# Patient Record
Sex: Female | Born: 1935 | Race: White | Marital: Single | Smoking: Never smoker
Health system: Southern US, Community
[De-identification: ages and names within clinical notes are randomized; demographics above are authoritative.]

## PROBLEM LIST (undated history)

## (undated) DIAGNOSIS — J302 Other seasonal allergic rhinitis: Secondary | ICD-10-CM

## (undated) HISTORY — PX: OTHER SURGICAL HISTORY: SHX169

## (undated) HISTORY — PX: EYE SURGERY: SHX253

## (undated) HISTORY — PX: CHOLECYSTECTOMY: SHX55

---

## 2011-06-28 ENCOUNTER — Inpatient Hospital Stay: Payer: Self-pay | Admitting: Surgery

## 2011-06-28 LAB — PROTIME-INR: Prothrombin Time: 13.4 secs (ref 11.5–14.7)

## 2011-06-28 LAB — CK TOTAL AND CKMB (NOT AT ARMC)
CK, Total: 73 U/L (ref 21–215)
CK-MB: 1.1 ng/mL (ref 0.5–3.6)

## 2011-06-28 LAB — URINALYSIS, COMPLETE
Bilirubin,UR: NEGATIVE
Glucose,UR: NEGATIVE mg/dL (ref 0–75)
Ketone: NEGATIVE
Nitrite: NEGATIVE
Ph: 6 (ref 4.5–8.0)
Protein: 30
Transitional Epi: <1

## 2011-06-28 LAB — COMPREHENSIVE METABOLIC PANEL
Albumin: 3.5 g/dL (ref 3.4–5.0)
Alkaline Phosphatase: 67 U/L (ref 50–136)
Anion Gap: 9 (ref 7–16)
Bilirubin,Total: 0.3 mg/dL (ref 0.2–1.0)
Co2: 26 mmol/L (ref 21–32)
Creatinine: 0.72 mg/dL (ref 0.60–1.30)
EGFR (African American): >60
EGFR (Non-African Amer.): >60
Potassium: 3.9 mmol/L (ref 3.5–5.1)
SGPT (ALT): 12 U/L
Sodium: 129 mmol/L — ABNORMAL LOW (ref 136–145)
Total Protein: 8.4 g/dL — ABNORMAL HIGH (ref 6.4–8.2)

## 2011-06-28 LAB — TSH: Thyroid Stimulating Horm: 1.9 u[IU]/mL

## 2011-06-28 LAB — CBC
HCT: 37.6 % (ref 35.0–47.0)
HGB: 12.2 g/dL (ref 12.0–16.0)
MCH: 28.5 pg (ref 26.0–34.0)
MCV: 88 fL (ref 80–100)
Platelet: 339 10*3/uL (ref 150–440)
RDW: 12.7 % (ref 11.5–14.5)

## 2011-06-29 LAB — CBC WITH DIFFERENTIAL/PLATELET
Basophil #: 0 10*3/uL (ref 0.0–0.1)
Basophil %: 0.4 %
Eosinophil #: 0.3 10*3/uL (ref 0.0–0.7)
Eosinophil %: 2.3 %
HCT: 33.9 % — ABNORMAL LOW (ref 35.0–47.0)
Lymphocyte %: 21.3 %
MCH: 28.5 pg (ref 26.0–34.0)
MCHC: 32.5 g/dL (ref 32.0–36.0)
MCV: 88 fL (ref 80–100)
Monocyte #: 1.2 x10 3/mm — ABNORMAL HIGH (ref 0.2–0.9)
Neutrophil #: 7.5 10*3/uL — ABNORMAL HIGH (ref 1.4–6.5)
Neutrophil %: 65.8 %
RBC: 3.87 10*6/uL (ref 3.80–5.20)
RDW: 12.9 % (ref 11.5–14.5)
WBC: 11.4 10*3/uL — ABNORMAL HIGH (ref 3.6–11.0)

## 2011-06-29 LAB — HEPATIC FUNCTION PANEL A (ARMC)
Albumin: 2.7 g/dL — ABNORMAL LOW (ref 3.4–5.0)
Bilirubin,Total: 0.3 mg/dL (ref 0.2–1.0)
SGPT (ALT): 12 U/L
Total Protein: 6.7 g/dL (ref 6.4–8.2)

## 2011-06-29 LAB — BASIC METABOLIC PANEL
Anion Gap: 10 (ref 7–16)
Creatinine: 0.76 mg/dL (ref 0.60–1.30)
Potassium: 3.5 mmol/L (ref 3.5–5.1)
Sodium: 130 mmol/L — ABNORMAL LOW (ref 136–145)

## 2011-06-30 LAB — CBC WITH DIFFERENTIAL/PLATELET
Basophil #: 0.1 10*3/uL (ref 0.0–0.1)
Lymphocyte #: 2.9 10*3/uL (ref 1.0–3.6)
MCH: 28.1 pg (ref 26.0–34.0)
Monocyte %: 13.9 %
Neutrophil #: 5.9 10*3/uL (ref 1.4–6.5)
Platelet: 306 10*3/uL (ref 150–440)
RBC: 3.74 10*6/uL — ABNORMAL LOW (ref 3.80–5.20)

## 2011-06-30 LAB — BASIC METABOLIC PANEL
Calcium, Total: 8.1 mg/dL — ABNORMAL LOW (ref 8.5–10.1)
Chloride: 98 mmol/L (ref 98–107)
Co2: 27 mmol/L (ref 21–32)
Creatinine: 0.82 mg/dL (ref 0.60–1.30)
Glucose: 107 mg/dL — ABNORMAL HIGH (ref 65–99)
Osmolality: 266 (ref 275–301)
Potassium: 4.1 mmol/L (ref 3.5–5.1)

## 2011-07-03 ENCOUNTER — Encounter (HOSPITAL_COMMUNITY): Payer: Self-pay | Admitting: Emergency Medicine

## 2011-07-03 ENCOUNTER — Emergency Department (HOSPITAL_COMMUNITY)
Admission: EM | Admit: 2011-07-03 | Discharge: 2011-07-03 | Disposition: A | Discharge status: Home / Self Care | Payer: Medicare PPO | Attending: Emergency Medicine | Admitting: Emergency Medicine

## 2011-07-03 DIAGNOSIS — Z9889 Other specified postprocedural states: Secondary | ICD-10-CM | POA: Insufficient documentation

## 2011-07-03 DIAGNOSIS — R21 Rash and other nonspecific skin eruption: Secondary | ICD-10-CM | POA: Insufficient documentation

## 2011-07-03 DIAGNOSIS — L0291 Cutaneous abscess, unspecified: Secondary | ICD-10-CM | POA: Insufficient documentation

## 2011-07-03 DIAGNOSIS — L0390 Cellulitis, unspecified: Secondary | ICD-10-CM | POA: Insufficient documentation

## 2011-07-03 HISTORY — DX: Other seasonal allergic rhinitis: J30.2

## 2011-07-03 LAB — DIFFERENTIAL
Eosinophils Absolute: 0.4 10*3/uL (ref 0.0–0.7)
Eosinophils Relative: 3 % (ref 0–5)
Lymphocytes Relative: 18 % (ref 12–46)
Lymphs Abs: 2.7 10*3/uL (ref 0.7–4.0)
Monocytes Relative: 13 % — ABNORMAL HIGH (ref 3–12)

## 2011-07-03 LAB — CBC
Hemoglobin: 11.6 g/dL — ABNORMAL LOW (ref 12.0–15.0)
MCH: 28.9 pg (ref 26.0–34.0)
MCV: 86.6 fL (ref 78.0–100.0)
Platelets: 410 10*3/uL — ABNORMAL HIGH (ref 150–400)
RBC: 4.02 MIL/uL (ref 3.87–5.11)
WBC: 14.6 10*3/uL — ABNORMAL HIGH (ref 4.0–10.5)

## 2011-07-03 LAB — POCT I-STAT, CHEM 8
BUN: 12 mg/dL (ref 6–23)
Creatinine, Ser: 0.8 mg/dL (ref 0.50–1.10)
Glucose, Bld: 112 mg/dL — ABNORMAL HIGH (ref 70–99)
Potassium: 4.3 mEq/L (ref 3.5–5.1)
Sodium: 132 mEq/L — ABNORMAL LOW (ref 135–145)

## 2011-07-03 MED ORDER — CLINDAMYCIN PHOSPHATE 600 MG/50ML IV SOLN: 600.0000 mg | Freq: Once | INTRAVENOUS | Status: DC

## 2011-07-03 MED ORDER — CLINDAMYCIN PHOSPHATE 600 MG/50ML IV SOLN
600.0000 mg | Freq: Once | INTRAVENOUS | Status: AC
  Administered 2011-07-03: 600 mg via INTRAVENOUS
  Filled 2011-07-03: qty 50

## 2011-07-03 MED ORDER — CLINDAMYCIN HCL 150 MG PO CAPS: 300.0000 mg | ORAL_CAPSULE | Freq: Three times a day (TID) | ORAL | Status: AC

## 2011-07-03 NOTE — Discharge Instructions (Signed)

## 2011-07-03 NOTE — ED Notes (Signed)
PT. REPORTS RIGHT ANTERIOR ARM REDDNESS AT PREVIOUS IV SITE  , STATES DISCHARGED FROM HOSPITAL YESTERDAY . NO FEVER OR CHILLS.

## 2011-07-03 NOTE — ED Provider Notes (Signed)
Medical screening examination/treatment/procedure(s) were conducted as a shared visit with non-physician practitioner(s) and myself.  I personally evaluated the patient during the encounter   Loren Racer, MD 07/03/11 2312

## 2011-07-03 NOTE — ED Notes (Signed)
Released from [facility] hospital yesterday. Site where IV was is red and painful. Rates pain as 8/10. Took pain med at home hydrocodone 5/325.

## 2011-07-03 NOTE — ED Provider Notes (Signed)
History     CSN: [id number]  Arrival date & time 07/03/11  2027   First MD Initiated Contact with Patient 07/03/11 2057      Chief Complaint  Patient presents with  . Arm Injury    (Consider location/radiation/quality/duration/timing/severity/associated sxs/prior treatment) HPI Comments: Pt presents to the ED with cc of right anterior arm redness at previous IV site. Pt was dc from [facility] hospital yesterday after being treated for Pneumothorax. Pt has follow up appointment scheduled already with Dr. Michela Pitcher, Thursday April 25th. Pt states that the onset of reddness began yesterday evening and that the area has been gradually spreading. Associated symptoms include extremity warmth, but the pt denies any extremity swelling, fevers, night sweats, chills, or arm pain. Pt has no other complaints at this time.   Patient is a 78 y.o. female presenting with arm injury. The history is provided by the patient.  Arm Injury  Pertinent negatives include no chest pain, no numbness, no visual disturbance, no abdominal pain, no headaches, no light-headedness and no weakness.    Past Medical History  Diagnosis Date  . Seasonal allergies     Past Surgical History  Procedure Date  . Cholecystectomy     No family history on file.  History  Substance Use Topics  . Smoking status: Never Smoker   . Smokeless tobacco: Not on file  . Alcohol Use: No    OB History    Grav Para Term Preterm Abortions TAB SAB Ect Mult Living                  Review of Systems  Constitutional: Negative for fever, chills and appetite change.  HENT: Negative for congestion.   Eyes: Negative for visual disturbance.  Respiratory: Negative for shortness of breath.   Cardiovascular: Negative for chest pain and leg swelling.  Gastrointestinal: Negative for abdominal pain.  Genitourinary: Negative for dysuria, urgency and frequency.  Skin: Positive for color change and rash. Negative for pallor and wound.    Neurological: Negative for dizziness, syncope, weakness, light-headedness, numbness and headaches.  Psychiatric/Behavioral: Negative for confusion.  All other systems reviewed and are negative.    Allergies  Review of patient's allergies indicates no known allergies.  Home Medications   Current Outpatient Rx  Name Route Sig Dispense Refill  . DIPHENHYDRAMINE-APAP (SLEEP) 25-500 MG PO TABS Oral Take 1 tablet by mouth at bedtime.    Marland Kitchen HYDROCODONE-ACETAMINOPHEN 5-325 MG PO TABS Oral Take 1 tablet by mouth every 6 (six) hours as needed. For pain    . LORATADINE 10 MG PO TABS Oral Take 10 mg by mouth daily.      BP 157/63  Pulse 93  Temp(Src) 98.1 F (36.7 C) (Oral)  Resp 19  SpO2 95%  Physical Exam  Constitutional: She is oriented to person, place, and time. She appears well-developed and well-nourished. No distress.  HENT:  Head: Normocephalic and atraumatic.  Mouth/Throat: Oropharynx is clear and moist. No oropharyngeal exudate.  Eyes: Conjunctivae and EOM are normal. Pupils are equal, round, and reactive to light. No scleral icterus.  Neck: Normal range of motion. Neck supple. No tracheal deviation present. No thyromegaly present.  Cardiovascular: Normal rate, regular rhythm, normal heart sounds and intact distal pulses.   Pulmonary/Chest: Effort normal and breath sounds normal. No stridor. No respiratory distress. She has no wheezes.  Abdominal: Soft.  Musculoskeletal: Normal range of motion. She exhibits no edema and no tenderness.  Neurological: She is alert and oriented to person, place,  and time. Coordination normal.  Skin: Skin is warm and dry. Rash noted. She is not diaphoretic. There is erythema. No pallor.     Psychiatric: She has a normal mood and affect. Her behavior is normal.    ED Course  Procedures (including critical care time)  Labs Reviewed  POCT I-STAT, CHEM 8 - Abnormal; Notable for the following:    Sodium 132 (*)    Chloride 94 (*)    Glucose,  Bld 112 (*)    All other components within normal limits  CBC  DIFFERENTIAL   No results found.   No diagnosis found.    MDM  Cellulitis  Pt has been treated in the ED with IV clindamycin 600 and will be dc with abx as well. Pt appears reliable for follow up with her PCP in 2-3 days. Pt is with VSS, NAD and labs WNL. No evidence of a systemic infection. Line has been drawn around erythema and pt has been instructed to return to the ED if the erythema spreads, she develops a fever or if she develops any concerning symptoms.  This pt was seen with Dr. Ranae Palms who agrees with the above plan.        Jaci Carrel, [place] 07/03/11 2255

## 2011-07-11 ENCOUNTER — Ambulatory Visit: Payer: Self-pay | Admitting: Surgery

## 2012-06-09 ENCOUNTER — Inpatient Hospital Stay: Payer: Self-pay | Admitting: Surgery

## 2012-06-09 LAB — CBC
HGB: 13 g/dL (ref 12.0–16.0)
MCHC: 32.3 g/dL (ref 32.0–36.0)
Platelet: 310 10*3/uL (ref 150–440)
RBC: 4.51 10*6/uL (ref 3.80–5.20)
WBC: 12 10*3/uL — ABNORMAL HIGH (ref 3.6–11.0)

## 2012-06-09 LAB — COMPREHENSIVE METABOLIC PANEL
Albumin: 3.4 g/dL (ref 3.4–5.0)
Alkaline Phosphatase: 81 U/L (ref 50–136)
Anion Gap: 4 — ABNORMAL LOW (ref 7–16)
Bilirubin,Total: 0.3 mg/dL (ref 0.2–1.0)
Co2: 28 mmol/L (ref 21–32)
Creatinine: 0.71 mg/dL (ref 0.60–1.30)
EGFR (African American): >60
Glucose: 95 mg/dL (ref 65–99)
Osmolality: 260 (ref 275–301)
Sodium: 130 mmol/L — ABNORMAL LOW (ref 136–145)

## 2012-06-13 LAB — CREATININE, SERUM
Creatinine: 0.64 mg/dL (ref 0.60–1.30)
EGFR (African American): >60

## 2012-06-24 ENCOUNTER — Ambulatory Visit: Payer: Self-pay | Admitting: Surgery

## 2012-06-25 ENCOUNTER — Ambulatory Visit: Payer: Self-pay | Admitting: Cardiothoracic Surgery

## 2012-07-02 ENCOUNTER — Ambulatory Visit: Payer: Self-pay

## 2012-07-14 ENCOUNTER — Ambulatory Visit: Payer: Self-pay | Admitting: Specialist

## 2012-08-03 ENCOUNTER — Ambulatory Visit: Payer: Medicare PPO | Admitting: Internal Medicine

## 2012-09-22 ENCOUNTER — Observation Stay (HOSPITAL_COMMUNITY)
Admission: EM | Admit: 2012-09-22 | Discharge: 2012-09-25 | Disposition: A | Discharge status: Skilled Nursing Facility | Payer: Medicare PPO | Attending: Internal Medicine | Admitting: Internal Medicine

## 2012-09-22 ENCOUNTER — Emergency Department (HOSPITAL_COMMUNITY): Payer: Medicare PPO

## 2012-09-22 ENCOUNTER — Encounter (HOSPITAL_COMMUNITY): Payer: Self-pay

## 2012-09-22 DIAGNOSIS — D72829 Elevated white blood cell count, unspecified: Secondary | ICD-10-CM | POA: Insufficient documentation

## 2012-09-22 DIAGNOSIS — S42309A Unspecified fracture of shaft of humerus, unspecified arm, initial encounter for closed fracture: Secondary | ICD-10-CM

## 2012-09-22 DIAGNOSIS — R4182 Altered mental status, unspecified: Secondary | ICD-10-CM | POA: Diagnosis present

## 2012-09-22 DIAGNOSIS — E871 Hypo-osmolality and hyponatremia: Secondary | ICD-10-CM | POA: Diagnosis present

## 2012-09-22 DIAGNOSIS — G9340 Encephalopathy, unspecified: Secondary | ICD-10-CM | POA: Diagnosis present

## 2012-09-22 DIAGNOSIS — E43 Unspecified severe protein-calorie malnutrition: Secondary | ICD-10-CM | POA: Insufficient documentation

## 2012-09-22 DIAGNOSIS — S42302A Unspecified fracture of shaft of humerus, left arm, initial encounter for closed fracture: Secondary | ICD-10-CM

## 2012-09-22 DIAGNOSIS — Y92009 Unspecified place in unspecified non-institutional (private) residence as the place of occurrence of the external cause: Secondary | ICD-10-CM | POA: Insufficient documentation

## 2012-09-22 DIAGNOSIS — Z8709 Personal history of other diseases of the respiratory system: Secondary | ICD-10-CM

## 2012-09-22 DIAGNOSIS — J8410 Pulmonary fibrosis, unspecified: Secondary | ICD-10-CM | POA: Insufficient documentation

## 2012-09-22 DIAGNOSIS — R296 Repeated falls: Secondary | ICD-10-CM | POA: Insufficient documentation

## 2012-09-22 DIAGNOSIS — S0990XA Unspecified injury of head, initial encounter: Secondary | ICD-10-CM

## 2012-09-22 DIAGNOSIS — E860 Dehydration: Secondary | ICD-10-CM

## 2012-09-22 DIAGNOSIS — M899 Disorder of bone, unspecified: Secondary | ICD-10-CM | POA: Insufficient documentation

## 2012-09-22 DIAGNOSIS — R5381 Other malaise: Secondary | ICD-10-CM | POA: Insufficient documentation

## 2012-09-22 DIAGNOSIS — G9349 Other encephalopathy: Principal | ICD-10-CM | POA: Insufficient documentation

## 2012-09-22 DIAGNOSIS — S42302D Unspecified fracture of shaft of humerus, left arm, subsequent encounter for fracture with routine healing: Secondary | ICD-10-CM

## 2012-09-22 DIAGNOSIS — D649 Anemia, unspecified: Secondary | ICD-10-CM | POA: Insufficient documentation

## 2012-09-22 DIAGNOSIS — S161XXA Strain of muscle, fascia and tendon at neck level, initial encounter: Secondary | ICD-10-CM

## 2012-09-22 DIAGNOSIS — R531 Weakness: Secondary | ICD-10-CM

## 2012-09-22 DIAGNOSIS — E876 Hypokalemia: Secondary | ICD-10-CM | POA: Diagnosis not present

## 2012-09-22 DIAGNOSIS — Z9181 History of falling: Secondary | ICD-10-CM | POA: Insufficient documentation

## 2012-09-22 DIAGNOSIS — Z79899 Other long term (current) drug therapy: Secondary | ICD-10-CM | POA: Insufficient documentation

## 2012-09-22 DIAGNOSIS — S42213A Unspecified displaced fracture of surgical neck of unspecified humerus, initial encounter for closed fracture: Secondary | ICD-10-CM | POA: Insufficient documentation

## 2012-09-22 DIAGNOSIS — W19XXXA Unspecified fall, initial encounter: Secondary | ICD-10-CM

## 2012-09-22 LAB — COMPREHENSIVE METABOLIC PANEL
Albumin: 3.5 g/dL (ref 3.5–5.2)
Alkaline Phosphatase: 52 U/L (ref 39–117)
BUN: 20 mg/dL (ref 6–23)
Calcium: 10 mg/dL (ref 8.4–10.5)
Creatinine, Ser: 0.64 mg/dL (ref 0.50–1.10)
GFR calc Af Amer: >90 mL/min (ref >90)
Glucose, Bld: 132 mg/dL — ABNORMAL HIGH (ref 70–99)
Total Protein: 8.1 g/dL (ref 6.0–8.3)

## 2012-09-22 LAB — URINALYSIS, ROUTINE W REFLEX MICROSCOPIC
Ketones, ur: 15 mg/dL — AB
Nitrite: NEGATIVE
Protein, ur: 30 mg/dL — AB
pH: 5 (ref 5.0–8.0)

## 2012-09-22 LAB — CBC WITH DIFFERENTIAL/PLATELET
Basophils Relative: 0 % (ref 0–1)
Eosinophils Absolute: 0 10*3/uL (ref 0.0–0.7)
Eosinophils Relative: 0 % (ref 0–5)
Hemoglobin: 14.6 g/dL (ref 12.0–15.0)
Lymphs Abs: 1 10*3/uL (ref 0.7–4.0)
MCH: 30 pg (ref 26.0–34.0)
MCHC: 35.3 g/dL (ref 30.0–36.0)
MCV: 85 fL (ref 78.0–100.0)
Monocytes Absolute: 1.7 10*3/uL — ABNORMAL HIGH (ref 0.1–1.0)
Monocytes Relative: 9 % (ref 3–12)
RBC: 4.87 MIL/uL (ref 3.87–5.11)

## 2012-09-22 LAB — URINE MICROSCOPIC-ADD ON

## 2012-09-22 LAB — CK TOTAL AND CKMB (NOT AT ARMC)
CK, MB: 6.3 ng/mL (ref 0.3–4.0)
Relative Index: 2.6 — ABNORMAL HIGH (ref 0.0–2.5)
Total CK: 240 U/L — ABNORMAL HIGH (ref 7–177)

## 2012-09-22 LAB — TROPONIN I: Troponin I: <0.3 ng/mL (ref <0.30)

## 2012-09-22 MED ORDER — TIOTROPIUM BROMIDE MONOHYDRATE 18 MCG IN CAPS
18.0000 ug | ORAL_CAPSULE | Freq: Every day | RESPIRATORY_TRACT | Status: DC
  Administered 2012-09-23 – 2012-09-25 (×3): 18 ug via RESPIRATORY_TRACT
  Filled 2012-09-22: qty 5

## 2012-09-22 MED ORDER — ALBUTEROL SULFATE (5 MG/ML) 0.5% IN NEBU: 2.5000 mg | INHALATION_SOLUTION | RESPIRATORY_TRACT | Status: DC | PRN

## 2012-09-22 MED ORDER — SODIUM CHLORIDE 0.9 % IV BOLUS (SEPSIS)
1000.0000 mL | Freq: Once | INTRAVENOUS | Status: AC
  Administered 2012-09-22: 1000 mL via INTRAVENOUS

## 2012-09-22 MED ORDER — LORATADINE 10 MG PO TABS
10.0000 mg | ORAL_TABLET | Freq: Every day | ORAL | Status: DC
  Administered 2012-09-23 – 2012-09-25 (×3): 10 mg via ORAL
  Filled 2012-09-22 (×3): qty 1

## 2012-09-22 MED ORDER — SODIUM CHLORIDE 0.9 % IJ SOLN
3.0000 mL | Freq: Two times a day (BID) | INTRAMUSCULAR | Status: DC
  Administered 2012-09-22 – 2012-09-25 (×2): 3 mL via INTRAVENOUS

## 2012-09-22 MED ORDER — SODIUM CHLORIDE 0.9 % IV SOLN
INTRAVENOUS | Status: DC
  Administered 2012-09-22 (×2): via INTRAVENOUS

## 2012-09-22 MED ORDER — ONDANSETRON HCL 4 MG/2ML IJ SOLN: 4.0000 mg | Freq: Four times a day (QID) | INTRAMUSCULAR | Status: DC | PRN

## 2012-09-22 MED ORDER — ONDANSETRON HCL 4 MG PO TABS: 4.0000 mg | ORAL_TABLET | Freq: Four times a day (QID) | ORAL | Status: DC | PRN

## 2012-09-22 NOTE — H&P (Addendum)
Chief Complaint:  Found down  HPI: 79 yo female h/o pulmonary fibrosis overall independent and relatively healthy was found down this evening by nephew.  Niece says they last spoke to her about 630p last night and she seemed mildly confused.  Then her husband, pt nephew tried calling at around 930p and she did not answer phone.  Tried calling her again this am and no answer either.  Nephew went to check on her later in day and found her covered in feces naked laying on the floor in her closet.  There was evidence of feces in bathroom and on floor.  No vomiting.  Patient does not recall what happened.  Was very confused when nephew found her.  She c/o left arm pain.  Very weak.  Her confusion seems to have resolved with ivf in the ED and she says she has not been having any fevers, no n/v/d.  No cp no sob, no abd pain.  No le edema or swelling.  Niece is present and says they have noticed she gets easily confused on occasion and has not been eating well for the last 3 months.  They question whether she has dementia or not.  She has no complaints now, is oriented x 3.  No problems speaking or moving any extremeties except left arm which is in sling and broken.  Review of Systems:  Positive and negative as per HPI otherwise all other systems are negative  Past Medical History: Past Medical History  Diagnosis Date  . Seasonal allergies    Past Surgical History  Procedure Laterality Date  . Cholecystectomy    . Eye surgery      bilateral  . Tubes for collapsed lungs      Medications: Prior to Admission medications   Medication Sig Start Date End Date Taking? Authorizing Provider  albuterol (PROVENTIL HFA;VENTOLIN HFA) 108 (90 BASE) MCG/ACT inhaler Inhale 2 puffs into the lungs every 6 (six) hours as needed for wheezing.   Yes Historical Provider, MD  Fluticasone Furoate-Vilanterol (BREO ELLIPTA) 100-25 MCG/INH AEPB Inhale 1 puff into the lungs daily.   Yes Historical Provider, MD  tiotropium  (SPIRIVA) 18 MCG inhalation capsule Place 18 mcg into inhaler and inhale daily.   Yes Historical Provider, MD  loratadine (CLARITIN) 10 MG tablet Take 10 mg by mouth daily.    Historical Provider, MD    Allergies:  No Known Allergies  Social History:  reports that she has never smoked. She has never used smokeless tobacco. She reports that she does not drink alcohol or use illicit drugs.  Family History: History reviewed. No pertinent family history.  Physical Exam: Filed Vitals:   09/22/12 1836 09/22/12 1848  BP:  122/72  Pulse:  99  Temp:  97.5 F (36.4 C)  TempSrc:  Oral  Resp:  16  SpO2: 96% 96%   General appearance: alert, cooperative and no distress Head: Normocephalic, without obvious abnormality, atraumatic Eyes: negative Nose: Nares normal. Septum midline. Mucosa normal. No drainage or sinus tenderness. Neck: no JVD and supple, symmetrical, trachea midline Lungs: clear to auscultation bilaterally Heart: regular rate and rhythm, S1, S2 normal, no murmur, click, rub or gallop Abdomen: soft, non-tender; bowel sounds normal; no masses,  no organomegaly Extremities: extremities normal, atraumatic, no cyanosis or edema  Left arm in sling good grasp Pulses: 2+ and symmetric Skin: Skin color, texture, turgor normal. No rashes or lesions Neurologic: Grossly normal    Labs on Admission:   Recent Labs  09/22/12  1937  NA 127*  K 4.5  CL 86*  CO2 26  GLUCOSE 132*  BUN 20  CREATININE 0.64  CALCIUM 10.0    Recent Labs  09/22/12 1937  AST 27  ALT 17  ALKPHOS 52  BILITOT 0.8  PROT 8.1  ALBUMIN 3.5    Recent Labs  09/22/12 1937  WBC 19.5*  NEUTROABS 16.8*  HGB 14.6  HCT 41.4  MCV 85.0  PLT 389    Recent Labs  09/22/12 1937  CKTOTAL 240*  CKMB 6.3*  TROPONINI <0.30   Radiological Exams on Admission: Dg Chest 2 View  09/22/2012   *RADIOLOGY REPORT*  Clinical Data: Fall, weakness.  Left arm pain.  CHEST - 2 VIEW  Comparison: None.  Findings:  Severe chronic appearing densities throughout the lungs, likely fibrosis.  Hyperinflation of the lungs.  Biapical pleural thickening.  Heart is normal size.  No effusions.  Partially images a left humeral neck fracture.  Please see humerus Report.  IMPRESSION: Chronic changes/fibrosis.   Original Report Authenticated By: Charlett Nose, M.D.   Ct Head Wo Contrast  09/22/2012   *RADIOLOGY REPORT*  Clinical Data: Weakness, fall, confusion  CT HEAD WITHOUT CONTRAST,CT CERVICAL SPINE WITHOUT CONTRAST  Technique:  Contiguous axial images were obtained from the base of the skull through the vertex without contrast.,Technique: Multidetector CT imaging of the cervical spine was performed. Multiplanar CT image reconstructions were also generated.  Comparison: None.  Findings: No skull fracture is noted.  Paranasal sinuses and mastoid air cells are unremarkable.  No intracranial hemorrhage, mass effect or midline shift.  No acute infarction. Moderate cerebral atrophy.  Periventricular and subcortical white matter decreased attenuation probable due to chronic small vessel ischemic changes.  No mass lesion is noted on this unenhanced scan.  IMPRESSION: No acute intracranial abnormality.  Moderate cerebral atrophy. Periventricular and subcortical white matter decreased attenuation probable due to chronic small vessel ischemic changes.  CT cervical spine without IV contrast:  Axial images of the cervical spine shows no acute fracture or subluxation.  Computer processed images shows no acute fracture or subluxation. Sagittal images shows some significant disc space flattening with anterior and posterior spurring at C4-C5 level. Mild disc space flattening with mild posterior spurring and mild anterior spurring at C5-C6 level.  Mild disc space flattening with mild anterior and posterior spurring at C6-C7 level.  There is mild to moderate spinal canal stenosis due to posterior spurring at C4- C5 and C5-C6 level.  Mild anterior  bridging osteophytes noted at C6- 7 level.  No prevertebral soft tissue swelling.  There is diffuse osteopenia.  Impression: 1.  No acute fracture or subluxation.  Diffuse osteopenia. Degenerative changes as described above.  There are paraseptal emphysematous changes in the lung apices. There is thickening of the interlobular septa and some peripheral fibrosis and bronchiectasis bilateral apical region.   Original Report Authenticated By: Natasha Mead, M.D.   Ct Cervical Spine Wo Contrast  09/22/2012   *RADIOLOGY REPORT*  Clinical Data: Weakness, fall, confusion  CT HEAD WITHOUT CONTRAST,CT CERVICAL SPINE WITHOUT CONTRAST  Technique:  Contiguous axial images were obtained from the base of the skull through the vertex without contrast.,Technique: Multidetector CT imaging of the cervical spine was performed. Multiplanar CT image reconstructions were also generated.  Comparison: None.  Findings: No skull fracture is noted.  Paranasal sinuses and mastoid air cells are unremarkable.  No intracranial hemorrhage, mass effect or midline shift.  No acute infarction. Moderate cerebral atrophy.  Periventricular and subcortical  white matter decreased attenuation probable due to chronic small vessel ischemic changes.  No mass lesion is noted on this unenhanced scan.  IMPRESSION: No acute intracranial abnormality.  Moderate cerebral atrophy. Periventricular and subcortical white matter decreased attenuation probable due to chronic small vessel ischemic changes.  CT cervical spine without IV contrast:  Axial images of the cervical spine shows no acute fracture or subluxation.  Computer processed images shows no acute fracture or subluxation. Sagittal images shows some significant disc space flattening with anterior and posterior spurring at C4-C5 level. Mild disc space flattening with mild posterior spurring and mild anterior spurring at C5-C6 level.  Mild disc space flattening with mild anterior and posterior spurring at C6-C7  level.  There is mild to moderate spinal canal stenosis due to posterior spurring at C4- C5 and C5-C6 level.  Mild anterior bridging osteophytes noted at C6- 7 level.  No prevertebral soft tissue swelling.  There is diffuse osteopenia.  Impression: 1.  No acute fracture or subluxation.  Diffuse osteopenia. Degenerative changes as described above.  There are paraseptal emphysematous changes in the lung apices. There is thickening of the interlobular septa and some peripheral fibrosis and bronchiectasis bilateral apical region.   Original Report Authenticated By: Natasha Mead, M.D.   Dg Humerus Left  09/22/2012   *RADIOLOGY REPORT*  Clinical Data: Fall, left arm pain.  LEFT HUMERUS - 2+ VIEW  Comparison: None  Findings: There is a mildly impacted left humeral neck fracture. Possible inferior subluxation of the humeral head may be related to joint effusion.  No additional acute bony abnormality.  IMPRESSION: Mildly impacted left humeral neck fracture.  Slight inferior subluxation.   Original Report Authenticated By: Charlett Nose, M.D.    Assessment/Plan  79 yo female found down at home with left humerus fracture, dehydration, and hyponatremia Principal Problem:   Dehydration, mild Active Problems:   Fall at home   Hyponatremia   Fracture of humerus, left, closed   Altered mental status   Weakness generalized   H/O pulmonary fibrosis  Place on ivf overnight.  No focal neurological deficits.  freq neuro cks overnight, if mild confusion does not resolve with treatment of her dehydration and hyponatremia, do further neurological w/u.  cxr and ua no source for infection.  Vss.  Monitor closely for any symptoms, denies any diarrhea or bleeding.  She does not have a PCP and wishes to establish with one, she only sees a pulmonologist.  obs on tele.  Repeat labs in am.  DAVID,RACHAL A 09/22/2012, 10:50 PM   Ortho called by edp per report for consult for humerus fracture.

## 2012-09-22 NOTE — ED Notes (Signed)
[id number]<ON> Expected date:<BR> Expected time:<BR> Means of arrival:<BR> Comments:<BR> ALOC

## 2012-09-22 NOTE — ED Notes (Signed)
Pt presents with EMS with c/o weakness. On EMS arrival, pt was found on the floor and pt told EMS she had been there since this morning. Pt told EMS she had no complaints of pain. Per EMS, pt has bouts of bowel incontinence and told EMS that she has been "seeing bugs". Nephew was on scene and he called EMS.

## 2012-09-22 NOTE — Plan of Care (Signed)
Problem: Consults Goal: General Medical Patient Education See Patient Education Module for specific education. Outcome: Progressing Educated pt and family member on fall precautions, IV fluids and medications.

## 2012-09-22 NOTE — ED Provider Notes (Signed)
History    CSN: [id number] Arrival date & time 09/22/12  1830  First MD Initiated Contact with Patient 09/22/12 1832     Chief Complaint  Patient presents with  . Weakness   (Consider location/radiation/quality/duration/timing/severity/associated sxs/prior Treatment) HPI Comments: Patient is an 79 year old female with history of seasonal allergies on minimal medications.  Presents to the ED by after EMS after family found her on the floor unable to get up.  She cannot tell me if she fell or what happened.  I am told she informed EMS that she has been on the floor since this morning.  She is unable to confirm this with me.  Per EMS, she reported "seeing bugs" en route to the ED.  Patient is a 79 y.o. female presenting with weakness. The history is provided by the patient.  Weakness This is a new problem. Episode onset: this morning. The problem occurs constantly. The problem has not changed since onset.Pertinent negatives include no chest pain. Associated symptoms comments: Left arm pain . Nothing aggravates the symptoms. Nothing relieves the symptoms. She has tried nothing for the symptoms. The treatment provided no relief.   Past Medical History  Diagnosis Date  . Seasonal allergies    Past Surgical History  Procedure Laterality Date  . Cholecystectomy     No family history on file. History  Substance Use Topics  . Smoking status: Never Smoker   . Smokeless tobacco: Not on file  . Alcohol Use: No   OB History   Grav Para Term Preterm Abortions TAB SAB Ect Mult Living                 Review of Systems  Cardiovascular: Negative for chest pain.  Neurological: Positive for weakness.  All other systems reviewed and are negative.    Allergies  Review of patient's allergies indicates no known allergies.  Home Medications   Current Outpatient Rx  Name  Route  Sig  Dispense  Refill  . clindamycin (CLEOCIN) 600 MG/50ML IVPB   Intravenous   Inject 50 mLs (600 mg total)  into the vein once.   50 mL   0   . diphenhydramine-acetaminophen (TYLENOL PM) 25-500 MG TABS   Oral   Take 1 tablet by mouth at bedtime.         Marland Kitchen HYDROcodone-acetaminophen (NORCO) 5-325 MG per tablet   Oral   Take 1 tablet by mouth every 6 (six) hours as needed. For pain         . loratadine (CLARITIN) 10 MG tablet   Oral   Take 10 mg by mouth daily.          SpO2 96% Physical Exam  Nursing note and vitals reviewed. Constitutional: No distress.  Patient is an elderly female in nad.  She is thin / cachectic.  She appears somewhat disoriented and is unable to tell me exactly what happened and why she is here.  HENT:  Head: Normocephalic and atraumatic.  Eyes: EOM are normal. Pupils are equal, round, and reactive to light.  Neck: Normal range of motion. Neck supple.  She has no cervical spine ttp and is able to turn head and look around without difficulty.  Cardiovascular: Normal rate, regular rhythm and normal heart sounds.   No murmur heard. Pulmonary/Chest: Effort normal and breath sounds normal. No respiratory distress.  Abdominal: Soft. Bowel sounds are normal. She exhibits no distension. There is no tenderness.  Musculoskeletal: Normal range of motion.  There is  bruising to the left upper humerus just below the shoulder.  She is able to move all fingers and sensation is intact.  Lymphadenopathy:    She has no cervical adenopathy.  Neurological: She is alert. No cranial nerve deficit. She exhibits normal muscle tone. Coordination normal.  She is disoriented to time and situation.  Skin: Skin is warm and dry. She is not diaphoretic.    ED Course  Procedures (including critical care time) Labs Reviewed  CBC WITH DIFFERENTIAL  COMPREHENSIVE METABOLIC PANEL  TROPONIN I  CK TOTAL AND CKMB  URINALYSIS, ROUTINE W REFLEX MICROSCOPIC   No results found. No diagnosis found.   Date: 09/22/2012  Rate: 98  Rhythm: normal sinus rhythm  QRS Axis: left axis  deviation  Intervals: normal  ST/T Wave abnormalities: normal  Conduction Disutrbances:none  Narrative Interpretation:   Old EKG Reviewed: none available    MDM  The patient presents with complaints of weakness.  She was found on the floor this evening by her son who believes she likely fell last night.  He tried to call her last night but she didn't answer.  He thought maybe she was asleep and tried to call again this morning but she was not home.  He went to check on her and found her on the floor.    The workup reveals a fracture of the left proximal humerus and sodium of 127.  There is no rhabdomyolysis.  The head ct and cervical spine ct's are negative.  Per the son, she remains confused and not at her baseline.  She appears clinically dehydrated.  She was given ivf's and medicine has been consulted for admission.  Dr. Onalee Hua agrees to admit.    Geoffery Lyons, MD 09/22/12 2146

## 2012-09-22 NOTE — Progress Notes (Signed)
Pt wishes nephew and niece to have proxy information for My Chart. Proxy forms given to nephew and he verbalizes understanding. Briscoe Burns BSN, RN-BC Admissions RN  09/22/2012 9:55 PM

## 2012-09-23 ENCOUNTER — Encounter (HOSPITAL_COMMUNITY): Payer: Self-pay | Admitting: Radiology

## 2012-09-23 ENCOUNTER — Observation Stay (HOSPITAL_COMMUNITY): Payer: Medicare PPO

## 2012-09-23 DIAGNOSIS — G9340 Encephalopathy, unspecified: Secondary | ICD-10-CM

## 2012-09-23 DIAGNOSIS — E43 Unspecified severe protein-calorie malnutrition: Secondary | ICD-10-CM | POA: Insufficient documentation

## 2012-09-23 DIAGNOSIS — S42309D Unspecified fracture of shaft of humerus, unspecified arm, subsequent encounter for fracture with routine healing: Secondary | ICD-10-CM

## 2012-09-23 LAB — BASIC METABOLIC PANEL
BUN: 20 mg/dL (ref 6–23)
Creatinine, Ser: 0.54 mg/dL (ref 0.50–1.10)
GFR calc non Af Amer: 82 mL/min — ABNORMAL LOW (ref >90)
Glucose, Bld: 119 mg/dL — ABNORMAL HIGH (ref 70–99)
Potassium: 3.7 mEq/L (ref 3.5–5.1)

## 2012-09-23 LAB — CBC
HCT: 35 % — ABNORMAL LOW (ref 36.0–46.0)
Hemoglobin: 12.2 g/dL (ref 12.0–15.0)
MCH: 29.5 pg (ref 26.0–34.0)
MCHC: 34.9 g/dL (ref 30.0–36.0)
RDW: 13.6 % (ref 11.5–15.5)

## 2012-09-23 LAB — VITAMIN B12: Vitamin B-12: 471 pg/mL (ref 211–911)

## 2012-09-23 MED ORDER — SODIUM CHLORIDE 0.9 % IV SOLN
INTRAVENOUS | Status: AC
  Administered 2012-09-23: 23:00:00 via INTRAVENOUS

## 2012-09-23 MED ORDER — ACETAMINOPHEN 325 MG PO TABS
650.0000 mg | ORAL_TABLET | Freq: Four times a day (QID) | ORAL | Status: DC | PRN
  Administered 2012-09-23 – 2012-09-24 (×2): 650 mg via ORAL
  Filled 2012-09-23 (×3): qty 2

## 2012-09-23 MED ORDER — ENSURE COMPLETE PO LIQD
237.0000 mL | Freq: Two times a day (BID) | ORAL | Status: DC
  Administered 2012-09-23 – 2012-09-25 (×4): 237 mL via ORAL

## 2012-09-23 NOTE — Progress Notes (Signed)
I was contacted regarding patient's left prox humerus fracture. It appears non-operative. I will order a CT scan of the shoulder to ensure that there is no dislocation, as this can not be assessed with current radiographs. Recommend sling to left LE for now. Full consult to follow.

## 2012-09-23 NOTE — Progress Notes (Signed)
[facility] PROGRESS NOTE  Charlene Boone ZOX:[id number] DOB: 1922/11/21 DOA: 09/22/2012 PCP: Default, Provider, MD Primary Pulmonologist: Dr. Meredeth Ide at [facility] in [place]  Brief narrative 79 year old female with history of pulmonary fibrosis, right lung collapse x2, lives alone, history of mild intermittent confusion for a couple of months, unsteady gait and frequent falls, DOE presented to the [facility] ED on 09/22/12 after she was found by family in her home on the floor covered in feces and naked. Patient did not recall what happened. Per family, she was much more confused since Monday. She complained of right arm pain. In the ED, x-rays revealed left humeral neck fracture, sodium 127, CK 240, negative troponin, WBC 19.5, chest x-ray without acute changes and UA negative. Hospitalist admission requested.  Assessment/Plan: 1. Acute encephalopathy:? Secondary to dehydration and mild hyponatremia complicating underlying dementia. Clinically seems to have improved and patient seems appropriate this morning. No focal deficits. CT head and neck without acute findings. 2. Left humeral neck fracture: Orthopedics consulted and have requested a CT of the left shoulder for further evaluation. Await further recommendations. Continue sling. 3. Dehydration with hyponatremia: Improving. Continue normal saline for additional 24 hours. Follow BMP. 4. Recurrent falls: PT, OT evaluation-weightbearing status as per orthopedic recommendations. Will likely need SNF on discharge. 5. Likely dementia: Management per problem #1. 6. History of pulmonary fibrosis/right lung collapse: Stable. 7. Leukocytosis: Likely stress margination. No clinical focus of sepsis. No antibiotics started. 8. Severe protein calorie malnutrition: Management per nutrition team.  Code Status:  DO NOT RESUSCITATE-confirmed with patient's niece Ms. Bonnie at bedside.  Family Communication:  Discussed with patient's niece  [name] Disposition Plan:  Likely SNF when stable.   Consultants:   Orthopedics  Procedures:   Left upper extremity sling  Antibiotics:   None   HPI/Subjective:  Mild pain in left shoulder. Denies any other complaints.  Objective: Filed Vitals:   09/22/12 2317 09/23/12 0639 09/23/12 0657 09/23/12 1030  BP: 123/52 126/62    Pulse: 91 81    Temp: 98.3 F (36.8 C) 98.4 F (36.9 C)    TempSrc: Axillary Axillary    Resp: 16 16    Height: 5\' 5"  (1.651 m)     Weight: 48.3 kg (106 lb 7.7 oz)  47.083 kg (103 lb 12.8 oz)   SpO2: 98% 100%  98%    Intake/Output Summary (Last 24 hours) at 09/23/12 1325 Last data filed at 09/23/12 1323  Gross per 24 hour  Intake    440 ml  Output      0 ml  Net    440 ml   Filed Weights   09/22/12 2317 09/23/12 0657  Weight: 48.3 kg (106 lb 7.7 oz) 47.083 kg (103 lb 12.8 oz)    Exam:   General exam: Comfortable and pleasant .  Respiratory system:  Reduced breath sounds bilaterally but seems clear to auscultation . No increased work of breathing.  Cardiovascular system: S1 & S2 heard, RRR. No JVD, murmurs, gallops, clicks or pedal edema. Telemetry: Sinus rhythm  Gastrointestinal system: Abdomen is nondistended, soft and nontender. Normal bowel sounds heard.  Central nervous system: Alert and oriented x3 . No focal neurological deficits.  Extremities: Symmetric 5 x 5 power. Left upper extremity in sling.   Data Reviewed: Basic Metabolic Panel:  Recent Labs Lab 09/22/12 1937 09/23/12 0510  NA 127* 129*  K 4.5 3.7  CL 86* 93*  CO2 26 25  GLUCOSE 132* 119*  BUN 20 20  CREATININE 0.64 0.54  CALCIUM 10.0 8.8   Liver Function Tests:  Recent Labs Lab 09/22/12 1937  AST 27  ALT 17  ALKPHOS 52  BILITOT 0.8  PROT 8.1  ALBUMIN 3.5   No results found for this basename: LIPASE, AMYLASE,  in the last 168 hours No results found for this basename: AMMONIA,  in the last 168 hours CBC:  Recent Labs Lab 09/22/12 1937  09/23/12 0510  WBC 19.5* 16.2*  NEUTROABS 16.8*  --   HGB 14.6 12.2  HCT 41.4 35.0*  MCV 85.0 84.7  PLT 389 339   Cardiac Enzymes:  Recent Labs Lab 09/22/12 1937 09/23/12 0150  CKTOTAL 240*  --   CKMB 6.3*  --   TROPONINI <0.30 <0.30   BNP (last 3 results) No results found for this basename: PROBNP,  in the last 8760 hours CBG: No results found for this basename: GLUCAP,  in the last 168 hours  No results found for this or any previous visit (from the past 240 hour(s)).   Studies: Dg Chest 2 View  09/22/2012   *RADIOLOGY REPORT*  Clinical Data: Fall, weakness.  Left arm pain.  CHEST - 2 VIEW  Comparison: None.  Findings: Severe chronic appearing densities throughout the lungs, likely fibrosis.  Hyperinflation of the lungs.  Biapical pleural thickening.  Heart is normal size.  No effusions.  Partially images a left humeral neck fracture.  Please see humerus Report.  IMPRESSION: Chronic changes/fibrosis.   Original Report Authenticated By: Charlett Nose, M.D.   Ct Head Wo Contrast  09/22/2012   *RADIOLOGY REPORT*  Clinical Data: Weakness, fall, confusion  CT HEAD WITHOUT CONTRAST,CT CERVICAL SPINE WITHOUT CONTRAST  Technique:  Contiguous axial images were obtained from the base of the skull through the vertex without contrast.,Technique: Multidetector CT imaging of the cervical spine was performed. Multiplanar CT image reconstructions were also generated.  Comparison: None.  Findings: No skull fracture is noted.  Paranasal sinuses and mastoid air cells are unremarkable.  No intracranial hemorrhage, mass effect or midline shift.  No acute infarction. Moderate cerebral atrophy.  Periventricular and subcortical white matter decreased attenuation probable due to chronic small vessel ischemic changes.  No mass lesion is noted on this unenhanced scan.  IMPRESSION: No acute intracranial abnormality.  Moderate cerebral atrophy. Periventricular and subcortical white matter decreased attenuation  probable due to chronic small vessel ischemic changes.  CT cervical spine without IV contrast:  Axial images of the cervical spine shows no acute fracture or subluxation.  Computer processed images shows no acute fracture or subluxation. Sagittal images shows some significant disc space flattening with anterior and posterior spurring at C4-C5 level. Mild disc space flattening with mild posterior spurring and mild anterior spurring at C5-C6 level.  Mild disc space flattening with mild anterior and posterior spurring at C6-C7 level.  There is mild to moderate spinal canal stenosis due to posterior spurring at C4- C5 and C5-C6 level.  Mild anterior bridging osteophytes noted at C6- 7 level.  No prevertebral soft tissue swelling.  There is diffuse osteopenia.  Impression: 1.  No acute fracture or subluxation.  Diffuse osteopenia. Degenerative changes as described above.  There are paraseptal emphysematous changes in the lung apices. There is thickening of the interlobular septa and some peripheral fibrosis and bronchiectasis bilateral apical region.   Original Report Authenticated By: Natasha Mead, M.D.   Ct Cervical Spine Wo Contrast  09/22/2012   *RADIOLOGY REPORT*  Clinical Data: Weakness, fall, confusion  CT HEAD WITHOUT  CONTRAST,CT CERVICAL SPINE WITHOUT CONTRAST  Technique:  Contiguous axial images were obtained from the base of the skull through the vertex without contrast.,Technique: Multidetector CT imaging of the cervical spine was performed. Multiplanar CT image reconstructions were also generated.  Comparison: None.  Findings: No skull fracture is noted.  Paranasal sinuses and mastoid air cells are unremarkable.  No intracranial hemorrhage, mass effect or midline shift.  No acute infarction. Moderate cerebral atrophy.  Periventricular and subcortical white matter decreased attenuation probable due to chronic small vessel ischemic changes.  No mass lesion is noted on this unenhanced scan.  IMPRESSION: No acute  intracranial abnormality.  Moderate cerebral atrophy. Periventricular and subcortical white matter decreased attenuation probable due to chronic small vessel ischemic changes.  CT cervical spine without IV contrast:  Axial images of the cervical spine shows no acute fracture or subluxation.  Computer processed images shows no acute fracture or subluxation. Sagittal images shows some significant disc space flattening with anterior and posterior spurring at C4-C5 level. Mild disc space flattening with mild posterior spurring and mild anterior spurring at C5-C6 level.  Mild disc space flattening with mild anterior and posterior spurring at C6-C7 level.  There is mild to moderate spinal canal stenosis due to posterior spurring at C4- C5 and C5-C6 level.  Mild anterior bridging osteophytes noted at C6- 7 level.  No prevertebral soft tissue swelling.  There is diffuse osteopenia.  Impression: 1.  No acute fracture or subluxation.  Diffuse osteopenia. Degenerative changes as described above.  There are paraseptal emphysematous changes in the lung apices. There is thickening of the interlobular septa and some peripheral fibrosis and bronchiectasis bilateral apical region.   Original Report Authenticated By: Natasha Mead, M.D.   Dg Humerus Left  09/22/2012   *RADIOLOGY REPORT*  Clinical Data: Fall, left arm pain.  LEFT HUMERUS - 2+ VIEW  Comparison: None  Findings: There is a mildly impacted left humeral neck fracture. Possible inferior subluxation of the humeral head may be related to joint effusion.  No additional acute bony abnormality.  IMPRESSION: Mildly impacted left humeral neck fracture.  Slight inferior subluxation.   Original Report Authenticated By: Charlett Nose, M.D.     Additional labs:   Scheduled Meds: . feeding supplement  237 mL Oral BID BM  . loratadine  10 mg Oral Daily  . sodium chloride  3 mL Intravenous Q12H  . tiotropium  18 mcg Inhalation Daily   Continuous Infusions:    Principal  Problem:   Dehydration, mild Active Problems:   Fall at home   Hyponatremia   Fracture of humerus, left, closed   Altered mental status   Weakness generalized   H/O pulmonary fibrosis   Protein-calorie malnutrition, severe    Time spent:  45 minutes.    [facility]  [facility] Pager [phone].   If 8PM-8AM, please contact night-coverage at [email or web address], password [facility] 09/23/2012, 1:25 PM  LOS: 1 day

## 2012-09-23 NOTE — Care Management Note (Addendum)
    Page 1 of 1   09/24/2012     4:31:57 PM   CARE MANAGEMENT NOTE 09/24/2012  Patient:  Charlene Boone, Charlene Boone   Account Number:  [id number]  Date Initiated:  09/23/2012  Documentation initiated by:  Lanier Clam  Subjective/Objective Assessment:   ADMITTED W/FALL,DEHYDRATION.     Action/Plan:   FROM HOME ALONE.HAS RW.ACTIVE W/CARESOUTH-HHRN.HAS PCP,PHARMACY.HAS NIECE SUPPORT-Charlene Boone.   Anticipated DC Date:  09/25/2012   Anticipated DC Plan:  SKILLED NURSING FACILITY      DC Planning Services  CM consult      [facility] Choice  Resumption Of Svcs/PTA Provider   Choice offered to / List presented to:  [name]           Status of service:  In process, will continue to follow Medicare Important Message given?   (If response is "NO", the following Medicare IM given date fields will be blank) Date Medicare IM given:   Date Additional Medicare IM given:    Discharge Disposition:    Per UR Regulation:  Reviewed for med. necessity/level of care/duration of stay  If discussed at Long Length of Stay Meetings, dates discussed:    Comments:  09/24/12 Charlene Jenson RN,BSN NCM [phone] PT-SNF.  09/23/12 Charlene Miggins RN,BSN NCM [phone] RECOMMEND PT CONS WHEN APPROPRIATE.CONFIRMED ACTIVE W/CARESOUTH HHRN.RECOMMEND IF NEEDING HHRN.ORTHO-L SHOULDER-NON OPERATIVE,L ARM SLING.

## 2012-09-23 NOTE — Consult Note (Signed)
Reason for Consult: [name]  Referring Physician: [name] is an 79 y.o. female.  HPI: Charlene Boone is a pleasant 79 yo female, right hand dominant, who we are seeing in the hospital today as a consult. She lives alone and unfortunately suffered a fall sometime Monday evening after 6:30pm. Her niece Charlene Boone reports having spoke on the phone with her that evening around 6:30 and then they found her the following day laying on the floor at home naked and in her own feces. [name] cannot recall what happened or how long she had been laying there. She cannot recall how she fell. Family reports she has been experiencing increased confusion over the past month. [name] was taken to [facility] ED and reported L shoulder pain and was found to have a L proximal humeral neck fracture. She reports no previous injury to her arm or shoulder. She describes her pain as a deep throbbing ache in the L shoulder but states it has been well controlled and the sling is helping. She denies any numbness, tingling, coldness, or swelling. She states it is painful to mover her arm in any way. She is unsure if she lost consciousness but has no recollection of the event. Her head CT in the ED was negative. She is being additionally followed by the medicine team for hyponatremia, UTI and encephalopathy.   Past Medical History  Diagnosis Date  . Seasonal allergies     Past Surgical History  Procedure Laterality Date  . Cholecystectomy    . Eye surgery      bilateral  . Tubes for collapsed lungs      History reviewed. No pertinent family history.  Social History:  reports that she has never smoked. She has never used smokeless tobacco. She reports that she does not drink alcohol or use illicit drugs.  Allergies: No Known Allergies  Medications: Current facility-administered medications:0.9 %  sodium chloride infusion, , Intravenous, Continuous, Charlene Etienne, MD, Last Rate: 50 mL/hr at 09/23/12 1350;   acetaminophen (TYLENOL) tablet 650 mg, 650 mg, Oral, Q6H PRN, Charlene Etienne, MD;  albuterol (PROVENTIL) (5 MG/ML) 0.5% nebulizer solution 2.5 mg, 2.5 mg, Nebulization, Q2H PRN, Charlene Kos, MD feeding supplement (ENSURE COMPLETE) liquid 237 mL, 237 mL, Oral, BID BM, Charlene Boone, RD, 237 mL at 09/23/12 1350;  loratadine (CLARITIN) tablet 10 mg, 10 mg, Oral, Daily, Charlene Kos, MD, 10 mg at 09/23/12 1102;  ondansetron (ZOFRAN) injection 4 mg, 4 mg, Intravenous, Q6H PRN, Charlene Kos, MD;  ondansetron (ZOFRAN) tablet 4 mg, 4 mg, Oral, Q6H PRN, Charlene Kos, MD sodium chloride 0.9 % injection 3 mL, 3 mL, Intravenous, Q12H, Charlene Kos, MD, 3 mL at 09/22/12 2330;  tiotropium [facility]) inhalation capsule 18 mcg, 18 mcg, Inhalation, Daily, Charlene Kos, MD, 18 mcg at 09/23/12 1030  Results for orders placed during the hospital encounter of 09/22/12 (from the past 48 hour(s))  CBC WITH DIFFERENTIAL     Status: Abnormal   Collection Time    09/22/12  7:37 PM      Result Value Range   WBC 19.5 (*) 4.0 - 10.5 K/uL   RBC 4.87  3.87 - 5.11 MIL/uL   Hemoglobin 14.6  12.0 - 15.0 g/dL   HCT 62.1  30.8 - 65.7 %   MCV 85.0  78.0 - 100.0 fL   MCH 30.0  26.0 - 34.0 pg   MCHC 35.3  30.0 - 36.0 g/dL   RDW 13.4  11.5 - 15.5 %   Platelets 389  150 - 400 K/uL   Neutrophils Relative % 86 (*) 43 - 77 %   Neutro Abs 16.8 (*) 1.7 - 7.7 K/uL   Lymphocytes Relative 5 (*) 12 - 46 %   Lymphs Abs 1.0  0.7 - 4.0 K/uL   Monocytes Relative 9  3 - 12 %   Monocytes Absolute 1.7 (*) 0.1 - 1.0 K/uL   Eosinophils Relative 0  0 - 5 %   Eosinophils Absolute 0.0  0.0 - 0.7 K/uL   Basophils Relative 0  0 - 1 %   Basophils Absolute 0.0  0.0 - 0.1 K/uL  COMPREHENSIVE METABOLIC PANEL     Status: Abnormal   Collection Time    09/22/12  7:37 PM      Result Value Range   Sodium 127 (*) 135 - 145 mEq/L   Potassium 4.5  3.5 - 5.1 mEq/L   Chloride 86 (*) 96 - 112 mEq/L   CO2 26  19 - 32 mEq/L   Glucose, Bld 132 (*) 70 - 99  mg/dL   BUN 20  6 - 23 mg/dL   Creatinine, Ser 5.78  0.50 - 1.10 mg/dL   Calcium 46.9  8.4 - 62.9 mg/dL   Total Protein 8.1  6.0 - 8.3 g/dL   Albumin 3.5  3.5 - 5.2 g/dL   AST 27  0 - 37 U/L   ALT 17  0 - 35 U/L   Alkaline Phosphatase 52  39 - 117 U/L   Total Bilirubin 0.8  0.3 - 1.2 mg/dL   GFR calc non Af Amer 78 (*) >90 mL/min   GFR calc Af Amer >90  >90 mL/min   Comment:            The eGFR has been calculated     using the CKD EPI equation.     This calculation has not been     validated in all clinical     situations.     eGFR's persistently     <90 mL/min signify     possible Chronic Kidney Disease.  TROPONIN I     Status: None   Collection Time    09/22/12  7:37 PM      Result Value Range   Troponin I <0.30  <0.30 ng/mL   Comment:            Due to the release kinetics of cTnI,     a negative result within the first hours     of the onset of symptoms does not rule out     myocardial infarction with certainty.     If myocardial infarction is still suspected,     repeat the test at appropriate intervals.  CK TOTAL AND CKMB     Status: Abnormal   Collection Time    09/22/12  7:37 PM      Result Value Range   Total CK 240 (*) 7 - 177 U/L   CK, MB 6.3 (*) 0.3 - 4.0 ng/mL   Comment: CRITICAL RESULT CALLED TO, READ BACK BY AND VERIFIED WITH:     [id number]     ACTUAL DATE OF CALL 09/22/2012   Relative Index 2.6 (*) 0.0 - 2.5  URINALYSIS, ROUTINE W REFLEX MICROSCOPIC     Status: Abnormal   Collection Time    09/22/12  8:18 PM      Result Value Range   Color, Urine AMBER (*) YELLOW  Comment: BIOCHEMICALS MAY BE AFFECTED BY COLOR   APPearance CLEAR  CLEAR   Specific Gravity, Urine 1.024  1.005 - 1.030   pH 5.0  5.0 - 8.0   Glucose, UA NEGATIVE  NEGATIVE mg/dL   Hgb urine dipstick SMALL (*) NEGATIVE   Bilirubin Urine SMALL (*) NEGATIVE   Ketones, ur 15 (*) NEGATIVE mg/dL   Protein, ur 30 (*) NEGATIVE mg/dL   Urobilinogen, UA 1.0  0.0 - 1.0 mg/dL    Nitrite NEGATIVE  NEGATIVE   Leukocytes, UA TRACE (*) NEGATIVE  URINE MICROSCOPIC-ADD ON     Status: None   Collection Time    09/22/12  8:18 PM      Result Value Range   WBC, UA 0-2  <3 WBC/hpf   RBC / HPF 0-2  <3 RBC/hpf   Urine-Other MUCOUS PRESENT    TROPONIN I     Status: None   Collection Time    09/23/12  1:50 AM      Result Value Range   Troponin I <0.30  <0.30 ng/mL   Comment:            Due to the release kinetics of cTnI,     a negative result within the first hours     of the onset of symptoms does not rule out     myocardial infarction with certainty.     If myocardial infarction is still suspected,     repeat the test at appropriate intervals.  BASIC METABOLIC PANEL     Status: Abnormal   Collection Time    09/23/12  5:10 AM      Result Value Range   Sodium 129 (*) 135 - 145 mEq/L   Potassium 3.7  3.5 - 5.1 mEq/L   Comment: DELTA CHECK NOTED     REPEATED TO VERIFY   Chloride 93 (*) 96 - 112 mEq/L   Comment: DELTA CHECK NOTED     REPEATED TO VERIFY   CO2 25  19 - 32 mEq/L   Glucose, Bld 119 (*) 70 - 99 mg/dL   BUN 20  6 - 23 mg/dL   Creatinine, Ser 1.61  0.50 - 1.10 mg/dL   Calcium 8.8  8.4 - 09.6 mg/dL   GFR calc non Af Amer 82 (*) >90 mL/min   GFR calc Af Amer >90  >90 mL/min   Comment:            The eGFR has been calculated     using the CKD EPI equation.     This calculation has not been     validated in all clinical     situations.     eGFR's persistently     <90 mL/min signify     possible Chronic Kidney Disease.  CBC     Status: Abnormal   Collection Time    09/23/12  5:10 AM      Result Value Range   WBC 16.2 (*) 4.0 - 10.5 K/uL   RBC 4.13  3.87 - 5.11 MIL/uL   Hemoglobin 12.2  12.0 - 15.0 g/dL   HCT 04.5 (*) 40.9 - 81.1 %   MCV 84.7  78.0 - 100.0 fL   MCH 29.5  26.0 - 34.0 pg   MCHC 34.9  30.0 - 36.0 g/dL   RDW 91.4  78.2 - 95.6 %   Platelets 339  150 - 400 K/uL    Dg Chest 2 View  09/22/2012   *RADIOLOGY REPORT*  Clinical  Data:  Fall, weakness.  Left arm pain.  CHEST - 2 VIEW  Comparison: None.  Findings: Severe chronic appearing densities throughout the lungs, likely fibrosis.  Hyperinflation of the lungs.  Biapical pleural thickening.  Heart is normal size.  No effusions.  Partially images a left humeral neck fracture.  Please see humerus Report.  IMPRESSION: Chronic changes/fibrosis.   Original Report Authenticated By: Charlett Nose, M.D.   Ct Head Wo Contrast  09/22/2012   *RADIOLOGY REPORT*  Clinical Data: Weakness, fall, confusion  CT HEAD WITHOUT CONTRAST,CT CERVICAL SPINE WITHOUT CONTRAST  Technique:  Contiguous axial images were obtained from the base of the skull through the vertex without contrast.,Technique: Multidetector CT imaging of the cervical spine was performed. Multiplanar CT image reconstructions were also generated.  Comparison: None.  Findings: No skull fracture is noted.  Paranasal sinuses and mastoid air cells are unremarkable.  No intracranial hemorrhage, mass effect or midline shift.  No acute infarction. Moderate cerebral atrophy.  Periventricular and subcortical white matter decreased attenuation probable due to chronic small vessel ischemic changes.  No mass lesion is noted on this unenhanced scan.  IMPRESSION: No acute intracranial abnormality.  Moderate cerebral atrophy. Periventricular and subcortical white matter decreased attenuation probable due to chronic small vessel ischemic changes.  CT cervical spine without IV contrast:  Axial images of the cervical spine shows no acute fracture or subluxation.  Computer processed images shows no acute fracture or subluxation. Sagittal images shows some significant disc space flattening with anterior and posterior spurring at C4-C5 level. Mild disc space flattening with mild posterior spurring and mild anterior spurring at C5-C6 level.  Mild disc space flattening with mild anterior and posterior spurring at C6-C7 level.  There is mild to moderate spinal canal  stenosis due to posterior spurring at C4- C5 and C5-C6 level.  Mild anterior bridging osteophytes noted at C6- 7 level.  No prevertebral soft tissue swelling.  There is diffuse osteopenia.  Impression: 1.  No acute fracture or subluxation.  Diffuse osteopenia. Degenerative changes as described above.  There are paraseptal emphysematous changes in the lung apices. There is thickening of the interlobular septa and some peripheral fibrosis and bronchiectasis bilateral apical region.   Original Report Authenticated By: Natasha Mead, M.D.   Ct Cervical Spine Wo Contrast  09/22/2012   *RADIOLOGY REPORT*  Clinical Data: Weakness, fall, confusion  CT HEAD WITHOUT CONTRAST,CT CERVICAL SPINE WITHOUT CONTRAST  Technique:  Contiguous axial images were obtained from the base of the skull through the vertex without contrast.,Technique: Multidetector CT imaging of the cervical spine was performed. Multiplanar CT image reconstructions were also generated.  Comparison: None.  Findings: No skull fracture is noted.  Paranasal sinuses and mastoid air cells are unremarkable.  No intracranial hemorrhage, mass effect or midline shift.  No acute infarction. Moderate cerebral atrophy.  Periventricular and subcortical white matter decreased attenuation probable due to chronic small vessel ischemic changes.  No mass lesion is noted on this unenhanced scan.  IMPRESSION: No acute intracranial abnormality.  Moderate cerebral atrophy. Periventricular and subcortical white matter decreased attenuation probable due to chronic small vessel ischemic changes.  CT cervical spine without IV contrast:  Axial images of the cervical spine shows no acute fracture or subluxation.  Computer processed images shows no acute fracture or subluxation. Sagittal images shows some significant disc space flattening with anterior and posterior spurring at C4-C5 level. Mild disc space flattening with mild posterior spurring and mild anterior spurring at C5-C6 level.   Mild disc  space flattening with mild anterior and posterior spurring at C6-C7 level.  There is mild to moderate spinal canal stenosis due to posterior spurring at C4- C5 and C5-C6 level.  Mild anterior bridging osteophytes noted at C6- 7 level.  No prevertebral soft tissue swelling.  There is diffuse osteopenia.  Impression: 1.  No acute fracture or subluxation.  Diffuse osteopenia. Degenerative changes as described above.  There are paraseptal emphysematous changes in the lung apices. There is thickening of the interlobular septa and some peripheral fibrosis and bronchiectasis bilateral apical region.   Original Report Authenticated By: Natasha Mead, M.D.   Dg Humerus Left  09/22/2012   *RADIOLOGY REPORT*  Clinical Data: Fall, left arm pain.  LEFT HUMERUS - 2+ VIEW  Comparison: None  Findings: There is a mildly impacted left humeral neck fracture. Possible inferior subluxation of the humeral head may be related to joint effusion.  No additional acute bony abnormality.  IMPRESSION: Mildly impacted left humeral neck fracture.  Slight inferior subluxation.   Original Report Authenticated By: Charlett Nose, M.D.   CT OF THE LEFT SHOULDER WITHOUT CONTRAST  Technique: Multidetector CT imaging was performed according to the  standard protocol. Multiplanar CT image reconstructions were also  generated.  Comparison: Radiographs 09/22/2012.  Findings: The humeral head is located. There is a mildly impacted  fracture of the humeral neck with apex anterolateral angulation.  This fracture is minimally comminuted and involves the lesser  tuberosity. There is no involvement of the humeral head articular  surface.  There is no evidence of scapular or clavicle fracture. There is a  left shoulder hemarthrosis with surrounding soft tissue edema. No  large soft tissue hematoma is identified.  Incidentally noted is moderate fibrosis throughout the left lung.  There is associated central bronchiectasis. Aortic  atherosclerosis  is partially imaged.  IMPRESSION:  1. Mildly comminuted and angulated fracture of the left humeral  neck as described.  2. No evidence of involvement of the humeral head articular  surface or dislocation.  3. Pulmonary fibrosis.  Review of Systems  Constitutional: Positive for malaise/fatigue. Negative for fever, chills, weight loss and diaphoresis.  HENT: Negative for neck pain.   Respiratory: Negative for shortness of breath.   Cardiovascular: Negative.   Musculoskeletal: Positive for joint pain and falls. Negative for myalgias and back pain.  Skin: Negative for itching and rash.  Neurological: Positive for loss of consciousness. Negative for dizziness, tingling, tremors, sensory change, speech change, seizures and weakness.  Psychiatric/Behavioral: Positive for memory loss.   Blood pressure 121/54, pulse 79, temperature 97.7 F (36.5 C), temperature source Oral, resp. rate 18, height 5\' 5"  (1.651 m), weight 47.083 kg (103 lb 12.8 oz), SpO2 99.00%. Physical Exam Pt is laying comfortably in hospital bed, left arm in sling. She appears rather cachectic and malnourished. She currently is alert and oriented x3 and in no acute distress. EOM intact, breathing non-labored.  Skin is intact about the LUE, there is an area of ecchymosis about the anterior lateral upper arm extending towards the elbow. No obvious swelling of LUE compared to contralateral side. The pt has pain with any active or passive shoulder ROM, full ROM of L elbow, wrist compared to contralateral side. Strength is 4+/5 and = BIL to hand intrinsics/finger flx/grip/wrist flx and ext. She has TTP about the proximal humerus. Non-tender about clavicle and elbow. She is neurovascularly intact with 2+ DRP = BIL, sensation in intact.    Assessment/Plan: -Mildly impacted [name] S/P unobserved  fall on 09/21/2012 in an elderly pt who lives alone  -L arm sling at all times   -No PT of LUE at this  time   -Cryotherapy can be utilized for additional pain control  -CT scan L shoulder negative for displacement, will continue with L UE sling and proceed with    non-operative treatment   -F/U in office with Dr Yevette Edwards for repeat radiographs in 1-2 weeks [facility])  -Pain currently moderate and appears to be well controlled on current regimen, no changes,   ideal to minimize pain medications given pts age and mental status   -A & P discussed with pt and her niece who was in the room and one of the primary caregivers  -OK to D/C from Orthopaedic standpoint pending CT results    -UTI, Hyponatremia, ? Encephalopathy followed by medicine service   Patients evaluation and plan discussed directly with Estill Bamberg, MD  Jason Coop J 09/23/2012, 3:42 PM

## 2012-09-23 NOTE — Progress Notes (Signed)
Pt's niece spoke with me about the conversation she had with the MD regarding code status. She stated to me that she and her brother remember the pt telling them that she would not want to be resuscitated if her heart stopped. She stated that she felt that pt should be made a NCB. She also reported that her brother thought the pt had a living will at home and he is trying to find those papers to bring in to be placed on pt's chart. MD made aware of discussion and pt's code status changed to NCB.   Arta Bruce [facility] 09/23/2012 12:10 PM

## 2012-09-23 NOTE — Progress Notes (Signed)
INITIAL NUTRITION ASSESSMENT  Pt meets criteria for severe MALNUTRITION in the context of chronic illness as evidenced by <75% estimated energy intake for the past few months in addition to pt with severe muscle wasting and subcutaneous fat loss in clavicles and acromion bone region.  DOCUMENTATION CODES Per approved criteria  -Severe malnutrition in the context of chronic illness -Underweight   INTERVENTION: - Ensure Complete BID - Downgrade diet to dysphagia 2 per pt request - Recommend MD consider resuming home Megace - Assisted pt with ordering meals - Encouraged increased PO intake - Will continue to monitor   NUTRITION DIAGNOSIS: Inadequate oral intake related to poor appetite as evidenced by <25% meal intake, niece's report.   Goal: Pt to consume >75% of meals/supplements  Monitor:  Weights, labs, intake   Reason for Assessment: Nutrition risk   79 y.o. female  Admitting Dx: Dehydration, mild  ASSESSMENT: Pt discussed during multidisciplinary rounds.   Pt with history of pulmonary fibrosis, overall independent, lives at home alone, found down on the floor being very confused. Met with pt, who was eating breakfast, states she eats 3 meals/day and drinks 3 Ensures/day at home. Pt reports she used to weigh 140 pounds but since her sister died 2 years ago she has lost weight. Pt denies any problems chewing or swallowing but does state that she has a lump on her throat and prefers foods to be chopped.   Per H&P, niece reports pt not eating well for 3 months. Niece present at bedside and reports pt was started on Megace in May 2014 which helped pt enjoy the taste of food more and improved her appetite only slightly. She reports before May, pt was not eating barely anything, just a few bites of food, then c/o getting full. She reports pt drinks probably 1 Ensure/day and is unsure of how many meals/day due to pt living alone and not cooking a lot for herself. She reports pt with  an enlarged thyroid in reference to pt's c/o having a lump on her throat. She thinks pt has lost 15 pounds in the past year. Pt with low sodium.   Nutrition Focused Physical Exam:  Subcutaneous Fat:  Orbital Region: mild/moderate wasting Upper Arm Region: mild/moderate wasting Thoracic and Lumbar Region: NA  Muscle:  Temple Region: mild/moderate wasting Clavicle Bone Region: severe wasting Clavicle and Acromion Bone Region: severe wasting Scapular Bone Region: NA Dorsal Hand: mild/moderate wasting Patellar Region: mild/moderate wasting Anterior Thigh Region: mild/moderate wasting Posterior Calf Region: mild/moderate wasting  Edema: None observed     Height: Ht Readings from Last 1 Encounters:  09/22/12 5\' 5"  (1.651 m)    Weight: Wt Readings from Last 1 Encounters:  09/23/12 103 lb 12.8 oz (47.083 kg)    Ideal Body Weight: 125 lb  % Ideal Body Weight: 82%  Wt Readings from Last 10 Encounters:  09/23/12 103 lb 12.8 oz (47.083 kg)    Usual Body Weight: 140 lb 2 years ago per pt  % Usual Body Weight: 79%  BMI:  Body mass index is 17.27 kg/(m^2). Underweight  Estimated Nutritional Needs: Kcal: 1400-1650 Protein: 70-95g Fluid: 1.4-1.6L/day  Skin: Stage 1 sacral pressure ulcer  Diet Order: Cardiac  EDUCATION NEEDS: -No education needs identified at this time  No intake or output data in the 24 hours ending 09/23/12 1040  Last BM: 7/8  Labs:   Recent Labs Lab 09/22/12 1937 09/23/12 0510  NA 127* 129*  K 4.5 3.7  CL 86* 93*  CO2  26 25  BUN 20 20  CREATININE 0.64 0.54  CALCIUM 10.0 8.8  GLUCOSE 132* 119*    CBG (last 3)  No results found for this basename: GLUCAP,  in the last 72 hours  Scheduled Meds: . loratadine  10 mg Oral Daily  . sodium chloride  3 mL Intravenous Q12H  . tiotropium  18 mcg Inhalation Daily    Continuous Infusions: . sodium chloride 100 mL/hr at 09/22/12 2330    Past Medical History  Diagnosis Date  . Seasonal  allergies     Past Surgical History  Procedure Laterality Date  . Cholecystectomy    . Eye surgery      bilateral  . Tubes for collapsed lungs       Levon Hedger MS, RD, LDN [phone] Pager [phone] After Hours Pager

## 2012-09-24 DIAGNOSIS — E876 Hypokalemia: Secondary | ICD-10-CM

## 2012-09-24 LAB — BASIC METABOLIC PANEL
GFR calc Af Amer: >90 mL/min (ref >90)
GFR calc non Af Amer: 85 mL/min — ABNORMAL LOW (ref >90)
Glucose, Bld: 105 mg/dL — ABNORMAL HIGH (ref 70–99)
Potassium: 3.4 mEq/L — ABNORMAL LOW (ref 3.5–5.1)
Sodium: 130 mEq/L — ABNORMAL LOW (ref 135–145)

## 2012-09-24 LAB — CBC
Hemoglobin: 10.9 g/dL — ABNORMAL LOW (ref 12.0–15.0)
MCH: 29.5 pg (ref 26.0–34.0)
Platelets: 292 10*3/uL (ref 150–400)
RBC: 3.69 MIL/uL — ABNORMAL LOW (ref 3.87–5.11)
WBC: 16.1 10*3/uL — ABNORMAL HIGH (ref 4.0–10.5)

## 2012-09-24 MED ORDER — ENSURE PUDDING PO PUDG
1.0000 | Freq: Three times a day (TID) | ORAL | Status: DC
  Administered 2012-09-24 – 2012-09-25 (×2): 1 via ORAL
  Filled 2012-09-24 (×5): qty 1

## 2012-09-24 MED ORDER — TRAMADOL HCL 50 MG PO TABS
50.0000 mg | ORAL_TABLET | Freq: Four times a day (QID) | ORAL | Status: DC | PRN
  Administered 2012-09-24 – 2012-09-25 (×2): 50 mg via ORAL
  Filled 2012-09-24 (×3): qty 1

## 2012-09-24 MED ORDER — POTASSIUM CHLORIDE CRYS ER 20 MEQ PO TBCR
40.0000 meq | EXTENDED_RELEASE_TABLET | Freq: Once | ORAL | Status: AC
  Administered 2012-09-24: 40 meq via ORAL
  Filled 2012-09-24: qty 2

## 2012-09-24 NOTE — Consult Note (Signed)
Agree with note above. No dislocation noted on CT. Pt to follow up in my office in 1-2 weeks.  Maintain left arm in sling until then.

## 2012-09-24 NOTE — Progress Notes (Signed)
[facility] PROGRESS NOTE  Zenda Herskowitz ZOX:[id number] DOB: April 22, 1922 DOA: 09/22/2012 PCP: Default, Provider, MD Primary Pulmonologist: Dr. Meredeth Ide at [facility] in [place]  Brief narrative 79 year old female with history of pulmonary fibrosis, right lung collapse x2, lives alone, history of mild intermittent confusion for a couple of months, unsteady gait and frequent falls, DOE presented to the [facility] ED on 09/22/12 after she was found by family in her home on the floor covered in feces and naked. Patient did not recall what happened. Per family, she was much more confused since Monday. She complained of right arm pain. In the ED, x-rays revealed left humeral neck fracture, sodium 127, CK 240, negative troponin, WBC 19.5, chest x-ray without acute changes and UA negative. Hospitalist admission requested.  Assessment/Plan: 1. Acute encephalopathy:? Secondary to dehydration and mild hyponatremia complicating underlying dementia. Clinically seems to have improved. No focal deficits. CT head and neck without acute findings. Vitamin B12: 471 & TSH: 0.839 2. Left humeral neck fracture: CT confirmed. Nondisplaced. Orthopedics input appreciated. Will continue left arm sling, no PT and nonweight bearing on left upper extremity and followup with orthopedics in one to 2 weeks as OP. Pain management. 3. Dehydration with hyponatremia: Improving. Continue normal saline for additional 24 hours. Seems euvolemic.? SIADH component. DC IV fluids and follow BMP in a.m. 4. Hypokalemia: By mouth replete and follow BMP 5. Recurrent falls: PT, OT evaluation-weightbearing status as per orthopedic recommendations. Will likely need SNF on discharge. 6. Likely dementia: Management per problem #1. 7. History of pulmonary fibrosis/right lung collapse: Stable. 8. Leukocytosis: Likely stress margination. No clinical focus of sepsis. No antibiotics started. 9. Severe protein calorie malnutrition:  Management per nutrition team. 10. Anemia: Possibly dilutional. Follow up CBC in a.m.  Code Status:  DO NOT RESUSCITATE-confirmed with patient's niece Ms. Bonnie at bedside.  Family Communication:  Discussed with patient's niece [name] on 7/9 Disposition Plan:  SNF, possibly 7/11   Consultants:   Orthopedics  Procedures:   Left upper extremity sling  Antibiotics:   None   HPI/Subjective:  Mild pain in left shoulder. Denies any other complaints.  Objective: Filed Vitals:   09/23/12 1342 09/24/12 0534 09/24/12 1030 09/24/12 1300  BP: 121/54 146/50  125/62  Pulse: 79 73  77  Temp: 97.7 F (36.5 C) 97.7 F (36.5 C)  97.9 F (36.6 C)  TempSrc: Oral Oral  Oral  Resp: 18 16  17   Height:      Weight:  49.2 kg (108 lb 7.5 oz)    SpO2: 99% 100% 100% 100%    Intake/Output Summary (Last 24 hours) at 09/24/12 1559 Last data filed at 09/24/12 1500  Gross per 24 hour  Intake   2540 ml  Output    300 ml  Net   2240 ml   Filed Weights   09/22/12 2317 09/23/12 0657 09/24/12 0534  Weight: 48.3 kg (106 lb 7.7 oz) 47.083 kg (103 lb 12.8 oz) 49.2 kg (108 lb 7.5 oz)    Exam:   General exam: Comfortable and pleasant .  Respiratory system:  Reduced breath sounds bilaterally but seems clear to auscultation . No increased work of breathing.  Cardiovascular system: S1 & S2 heard, RRR. No JVD, murmurs, gallops, clicks or pedal edema. Telemetry: Sinus rhythm  Gastrointestinal system: Abdomen is nondistended, soft and nontender. Normal bowel sounds heard.  Central nervous system: Alert and oriented x3 . No focal neurological deficits.  Extremities: Symmetric 5 x 5 power. Left upper extremity  in sling.   Data Reviewed: Basic Metabolic Panel:  Recent Labs Lab 09/22/12 1937 09/23/12 0510 09/24/12 0450  NA 127* 129* 130*  K 4.5 3.7 3.4*  CL 86* 93* 97  CO2 26 25 28   GLUCOSE 132* 119* 105*  BUN 20 20 15   CREATININE 0.64 0.54 0.49*  CALCIUM 10.0 8.8 8.4   Liver  Function Tests:  Recent Labs Lab 09/22/12 1937  AST 27  ALT 17  ALKPHOS 52  BILITOT 0.8  PROT 8.1  ALBUMIN 3.5   No results found for this basename: LIPASE, AMYLASE,  in the last 168 hours No results found for this basename: AMMONIA,  in the last 168 hours CBC:  Recent Labs Lab 09/22/12 1937 09/23/12 0510 09/24/12 0450  WBC 19.5* 16.2* 16.1*  NEUTROABS 16.8*  --   --   HGB 14.6 12.2 10.9*  HCT 41.4 35.0* 31.6*  MCV 85.0 84.7 85.6  PLT 389 339 292   Cardiac Enzymes:  Recent Labs Lab 09/22/12 1937 09/23/12 0150  CKTOTAL 240*  --   CKMB 6.3*  --   TROPONINI <0.30 <0.30   BNP (last 3 results) No results found for this basename: PROBNP,  in the last 8760 hours CBG: No results found for this basename: GLUCAP,  in the last 168 hours  No results found for this or any previous visit (from the past 240 hour(s)).   Studies: Dg Chest 2 View  09/22/2012   *RADIOLOGY REPORT*  Clinical Data: Fall, weakness.  Left arm pain.  CHEST - 2 VIEW  Comparison: None.  Findings: Severe chronic appearing densities throughout the lungs, likely fibrosis.  Hyperinflation of the lungs.  Biapical pleural thickening.  Heart is normal size.  No effusions.  Partially images a left humeral neck fracture.  Please see humerus Report.  IMPRESSION: Chronic changes/fibrosis.   Original Report Authenticated By: Charlett Nose, M.D.   Ct Head Wo Contrast  09/22/2012   *RADIOLOGY REPORT*  Clinical Data: Weakness, fall, confusion  CT HEAD WITHOUT CONTRAST,CT CERVICAL SPINE WITHOUT CONTRAST  Technique:  Contiguous axial images were obtained from the base of the skull through the vertex without contrast.,Technique: Multidetector CT imaging of the cervical spine was performed. Multiplanar CT image reconstructions were also generated.  Comparison: None.  Findings: No skull fracture is noted.  Paranasal sinuses and mastoid air cells are unremarkable.  No intracranial hemorrhage, mass effect or midline shift.  No acute  infarction. Moderate cerebral atrophy.  Periventricular and subcortical white matter decreased attenuation probable due to chronic small vessel ischemic changes.  No mass lesion is noted on this unenhanced scan.  IMPRESSION: No acute intracranial abnormality.  Moderate cerebral atrophy. Periventricular and subcortical white matter decreased attenuation probable due to chronic small vessel ischemic changes.  CT cervical spine without IV contrast:  Axial images of the cervical spine shows no acute fracture or subluxation.  Computer processed images shows no acute fracture or subluxation. Sagittal images shows some significant disc space flattening with anterior and posterior spurring at C4-C5 level. Mild disc space flattening with mild posterior spurring and mild anterior spurring at C5-C6 level.  Mild disc space flattening with mild anterior and posterior spurring at C6-C7 level.  There is mild to moderate spinal canal stenosis due to posterior spurring at C4- C5 and C5-C6 level.  Mild anterior bridging osteophytes noted at C6- 7 level.  No prevertebral soft tissue swelling.  There is diffuse osteopenia.  Impression: 1.  No acute fracture or subluxation.  Diffuse osteopenia. Degenerative  changes as described above.  There are paraseptal emphysematous changes in the lung apices. There is thickening of the interlobular septa and some peripheral fibrosis and bronchiectasis bilateral apical region.   Original Report Authenticated By: Natasha Mead, M.D.   Ct Cervical Spine Wo Contrast  09/22/2012   *RADIOLOGY REPORT*  Clinical Data: Weakness, fall, confusion  CT HEAD WITHOUT CONTRAST,CT CERVICAL SPINE WITHOUT CONTRAST  Technique:  Contiguous axial images were obtained from the base of the skull through the vertex without contrast.,Technique: Multidetector CT imaging of the cervical spine was performed. Multiplanar CT image reconstructions were also generated.  Comparison: None.  Findings: No skull fracture is noted.   Paranasal sinuses and mastoid air cells are unremarkable.  No intracranial hemorrhage, mass effect or midline shift.  No acute infarction. Moderate cerebral atrophy.  Periventricular and subcortical white matter decreased attenuation probable due to chronic small vessel ischemic changes.  No mass lesion is noted on this unenhanced scan.  IMPRESSION: No acute intracranial abnormality.  Moderate cerebral atrophy. Periventricular and subcortical white matter decreased attenuation probable due to chronic small vessel ischemic changes.  CT cervical spine without IV contrast:  Axial images of the cervical spine shows no acute fracture or subluxation.  Computer processed images shows no acute fracture or subluxation. Sagittal images shows some significant disc space flattening with anterior and posterior spurring at C4-C5 level. Mild disc space flattening with mild posterior spurring and mild anterior spurring at C5-C6 level.  Mild disc space flattening with mild anterior and posterior spurring at C6-C7 level.  There is mild to moderate spinal canal stenosis due to posterior spurring at C4- C5 and C5-C6 level.  Mild anterior bridging osteophytes noted at C6- 7 level.  No prevertebral soft tissue swelling.  There is diffuse osteopenia.  Impression: 1.  No acute fracture or subluxation.  Diffuse osteopenia. Degenerative changes as described above.  There are paraseptal emphysematous changes in the lung apices. There is thickening of the interlobular septa and some peripheral fibrosis and bronchiectasis bilateral apical region.   Original Report Authenticated By: Natasha Mead, M.D.   Ct Shoulder Left Wo Contrast  09/23/2012   *RADIOLOGY REPORT*  Clinical Data: Status post fall.  Evaluate proximal left humeral fracture and possible dislocation.  CT OF THE LEFT SHOULDER WITHOUT CONTRAST  Technique:  Multidetector CT imaging was performed according to the standard protocol. Multiplanar CT image reconstructions were also  generated.  Comparison: Radiographs 09/22/2012.  Findings: The humeral head is located.  There is a mildly impacted fracture of the humeral neck with apex anterolateral angulation. This fracture is minimally comminuted and involves the lesser tuberosity.  There is no involvement of the humeral head articular surface.  There is no evidence of scapular or clavicle fracture.  There is a left shoulder hemarthrosis with surrounding soft tissue edema.  No large soft tissue hematoma is identified.  Incidentally noted is moderate fibrosis throughout the left lung. There is associated central bronchiectasis.  Aortic atherosclerosis is partially imaged.  IMPRESSION:  1.  Mildly comminuted and angulated fracture of the left humeral neck as described. 2.  No evidence of involvement of the humeral head articular surface or dislocation. 3.  Pulmonary fibrosis.   Original Report Authenticated By: Carey Bullocks, M.D.   Dg Humerus Left  09/22/2012   *RADIOLOGY REPORT*  Clinical Data: Fall, left arm pain.  LEFT HUMERUS - 2+ VIEW  Comparison: None  Findings: There is a mildly impacted left humeral neck fracture. Possible inferior subluxation of the humeral head  may be related to joint effusion.  No additional acute bony abnormality.  IMPRESSION: Mildly impacted left humeral neck fracture.  Slight inferior subluxation.   Original Report Authenticated By: Charlett Nose, M.D.     Additional labs:   Scheduled Meds: . feeding supplement  237 mL Oral BID BM  . feeding supplement  1 Container Oral TID BM  . loratadine  10 mg Oral Daily  . sodium chloride  3 mL Intravenous Q12H  . tiotropium  18 mcg Inhalation Daily   Continuous Infusions:    Principal Problem:   Acute encephalopathy Active Problems:   Fall at home   Fracture of humerus, left, closed   Altered mental status   Weakness generalized   H/O pulmonary fibrosis   Protein-calorie malnutrition, severe   Dehydration with hyponatremia    Time spent:  25  minutes.    [facility]  [facility] Pager [phone].   If 8PM-8AM, please contact night-coverage at [email or web address], password [facility] 09/24/2012, 3:59 PM  LOS: 2 days

## 2012-09-24 NOTE — Evaluation (Signed)
Physical Therapy Evaluation Patient Details Name: Charlene Boone MRN: [id number] DOB: [birth year] Today's Date: 09/24/2012 Time: 4098-1191 PT Time Calculation (min): 27 min  PT Assessment / Plan / Recommendation History of Present Illness  79 yo female h/o pulmonary fibrosis overall independent and relatively healthy was found down this evening by nephew.  Niece says they last spoke to her about 630p last night and she seemed mildly confused.  Then her husband, pt nephew tried calling at around 930p and she did not answer phone.  Tried calling her again this am and no answer either.  Nephew went to check on her later in day and found her covered in feces naked laying on the floor in her closet.  There was evidence of feces in bathroom and on floor.  No vomiting.  Patient does not recall what happened.  Was very confused when nephew found her.  She c/o left arm pain.  Very weak.  Her confusion seems to have resolved with ivf in the ED   Clinical Impression      PT Assessment  Patient needs continued PT services    Follow Up Recommendations  SNF    Does the patient have the potential to tolerate intense rehabilitation      Barriers to Discharge Decreased caregiver support      Equipment Recommendations  Cane    Recommendations for Other Services     Frequency Min 3X/week    Precautions / Restrictions Precautions Precautions: Shoulder Type of Shoulder Precautions: No ROM  to left shoulder; NWB LUE Shoulder Interventions: Shoulder sling/immobilizer Required Braces or Orthoses: Sling   Pertinent Vitals/Pain Denies pain      Mobility  Bed Mobility Bed Mobility: Supine to Sit Supine to Sit: 4: Min assist Transfers Transfers: Sit to Stand;Stand to Sit;Stand Pivot Transfers Sit to Stand: 4: Min assist;3: Mod assist Stand to Sit: 4: Min assist;3: Mod assist Stand Pivot Transfers: 4: Min assist;3: Mod assist Details for Transfer Assistance: cues for hand placement and incr time   Ambulation/Gait Ambulation/Gait Assistance: 4: Min assist Ambulation Distance (Feet): 6 Feet Assistive device: 1 person hand held assist Ambulation/Gait Assistance Details: assist for balance; requires increased time  Gait Pattern: Narrow base of support;Decreased stride length    Exercises     PT Diagnosis: Difficulty walking  PT Problem List: Decreased mobility;Decreased knowledge of precautions;Decreased balance;Decreased activity tolerance;Decreased range of motion;Decreased strength PT Treatment Interventions: Gait training;Functional mobility training;Balance training;Therapeutic exercise;DME instruction;Patient/family education     PT Goals(Current goals can be found in the care plan section) Acute Rehab PT Goals Patient Stated Goal: home but is aware she might need rehab Time For Goal Achievement: 10/08/12 Potential to Achieve Goals: Good  Visit Information  Last PT Received On: 09/24/12 Assistance Needed: +1 History of Present Illness: 79 yo female h/o pulmonary fibrosis overall independent and relatively healthy was found down this evening by nephew.  Niece says they last spoke to her about 630p last night and she seemed mildly confused.  Then her husband, pt nephew tried calling at around 930p and she did not answer phone.  Tried calling her again this am and no answer either.  Nephew went to check on her later in day and found her covered in feces naked laying on the floor in her closet.  There was evidence of feces in bathroom and on floor.  No vomiting.  Patient does not recall what happened.  Was very confused when nephew found her.  She c/o left arm pain.  Very weak.  Her confusion seems to have resolved with ivf in the ED        Prior Functioning  Home Living Family/patient expects to be discharged to:: Private residence Living Arrangements: Alone Type of Home: House Home Access: Stairs to enter [employer] of Steps: 2-3 Entrance Stairs-Rails:  Right;Left Home Layout: One level Home Equipment: None Prior Function Level of Independence: Independent Comments: cleaning assist 1x per month;  Communication Communication: No difficulties Dominant Hand: Right    Cognition  Cognition Arousal/Alertness: Awake/alert Behavior During Therapy: WFL for tasks assessed/performed Overall Cognitive Status: Within Functional Limits for tasks assessed Area of Impairment: Orientation General Comments: pt unable to state location intially, able to choose correctly with multiple choice; pt unable to state number to call during an emergency at home but reports that the number is on her phone anyway.    Extremity/Trunk Assessment Upper Extremity Assessment Upper Extremity Assessment: RUE deficits/detail RUE Deficits / Details: grossly 3+/5 LUE: Unable to fully assess due to immobilization Lower Extremity Assessment Lower Extremity Assessment: Generalized weakness   Balance    End of Session PT - End of Session Equipment Utilized During Treatment: Gait belt Activity Tolerance: Patient tolerated treatment well Patient left: in chair;with call bell/phone within reach;with chair alarm set Nurse Communication: Mobility status  GP     [facility] 09/24/2012, 2:55 PM

## 2012-09-24 NOTE — Clinical Social Work Psychosocial (Signed)
     Clinical Social Work Department BRIEF PSYCHOSOCIAL ASSESSMENT 09/24/2012  Patient:  Charlene Boone, Charlene Boone     Account Number:  [id number]     Admit date:  09/22/2012  Clinical Social Worker:  Hattie Perch  Date/Time:  09/24/2012 12:00 M  Referred by:  Physician  Date Referred:  09/24/2012 Referred for  SNF Placement   Other Referral:   Interview type:  Patient Other interview type:   niece    PSYCHOSOCIAL DATA Living Status:  ALONE Admitted from facility:   Level of care:   Primary support name:  Donaciano Eva Primary support relationship to patient:  FAMILY Degree of support available:   good    CURRENT CONCERNS Current Concerns  Post-Acute Placement   Other Concerns:    SOCIAL WORK ASSESSMENT / PLAN CSW met with patient. patient is alert and oriented X3. patient's niece, Kendal Hymen, was also at bedside. Patient in need of snf placement. patient in need of snf placement. Patient is very disappointed that she will need snf but understands. Patient is agreeable to being faxed out. CSW explained snf process and humana process. patient's niece asked that patient be faxed out to [facility] as well as [facility].   Assessment/plan status:   Other assessment/ plan:   Information/referral to community resources:    PATIENTS/FAMILYS RESPONSE TO PLAN OF CARE: patient is disappointed that she needs snf as she had lived home alone and has always been independent with minimal medical issues. patient is understanding of need for snf and is agreeable to being faxed out and recieving bed offers.

## 2012-09-24 NOTE — Clinical Social Work Placement (Addendum)
     Clinical Social Work Department CLINICAL SOCIAL WORK PLACEMENT NOTE 09/25/2012  Patient:  Charlene Boone, Charlene Boone  Account Number:  [id number] Admit date:  09/22/2012  Clinical Social Worker:  Becky Sax, LCSW  Date/time:  09/24/2012 12:00 M  Clinical Social Work is seeking post-discharge placement for this patient at the following level of care:   SKILLED NURSING   (*CSW will update this form in Epic as items are completed)   09/24/2012  Patient/family provided with [facility] Department of Clinical Social Works list of facilities offering this level of care within the geographic area requested by the patient (or if unable, by the patients family).  09/24/2012  Patient/family informed of their freedom to choose among providers that offer the needed level of care, that participate in Medicare, Medicaid or managed care program needed by the patient, have an available bed and are willing to accept the patient.  09/24/2012  Patient/family informed of [facility] ownership interest in [facility], as well as of the fact that they are under no obligation to receive care at this facility.  PASARR submitted to EDS on 09/24/2012 PASARR number received from EDS on 09/24/2012  FL2 transmitted to all facilities in geographic area requested by pt/family on  09/24/2012 FL2 transmitted to all facilities within larger geographic area on   Patient informed that his/her managed care company has contracts with or will negotiate with  certain facilities, including the following:     Patient/family informed of bed offers received:  09/25/2012 Patient chooses bed at [facility] Physician recommends and patient chooses bed at    Patient to be transferred to [facility] on  09/25/2012 Patient to be transferred to facility by ptar  The following physician request were entered in Epic:   Additional Comments:

## 2012-09-25 LAB — CBC
HCT: 30.9 % — ABNORMAL LOW (ref 36.0–46.0)
Hemoglobin: 10.5 g/dL — ABNORMAL LOW (ref 12.0–15.0)
MCV: 86.8 fL (ref 78.0–100.0)
RBC: 3.56 MIL/uL — ABNORMAL LOW (ref 3.87–5.11)
WBC: 14.8 10*3/uL — ABNORMAL HIGH (ref 4.0–10.5)

## 2012-09-25 LAB — BASIC METABOLIC PANEL
BUN: 15 mg/dL (ref 6–23)
CO2: 28 mEq/L (ref 19–32)
Chloride: 96 mEq/L (ref 96–112)
Creatinine, Ser: 0.51 mg/dL (ref 0.50–1.10)
Glucose, Bld: 108 mg/dL — ABNORMAL HIGH (ref 70–99)
Potassium: 3.8 mEq/L (ref 3.5–5.1)

## 2012-09-25 MED ORDER — ENSURE PUDDING PO PUDG: 1.0000 | Freq: Three times a day (TID) | ORAL | Status: AC

## 2012-09-25 MED ORDER — ACETAMINOPHEN 325 MG PO TABS: 650.0000 mg | ORAL_TABLET | Freq: Four times a day (QID) | ORAL | Status: AC | PRN

## 2012-09-25 MED ORDER — ENSURE COMPLETE PO LIQD: 237.0000 mL | Freq: Two times a day (BID) | ORAL | Status: AC

## 2012-09-25 MED ORDER — TRAMADOL HCL 50 MG PO TABS
50.0000 mg | ORAL_TABLET | Freq: Four times a day (QID) | ORAL | Status: AC | PRN
Start: 2012-09-25 — End: ?

## 2012-09-25 NOTE — Evaluation (Signed)
Occupational Therapy Evaluation Patient Details Name: Charlene Boone MRN: [id number] DOB: 1922/06/11 Today's Date: 09/25/2012 Time: 4098-1191 OT Time Calculation (min): 18 min  OT Assessment / Plan / Recommendation History of present illness 79 yo female h/o pulmonary fibrosis overall independent and relatively healthy was found down this evening by nephew.  Niece says they last spoke to her about 630p last night and she seemed mildly confused.  Then her husband, pt nephew tried calling at around 930p and she did not answer phone.  Tried calling her again this am and no answer either.  Nephew went to check on her later in day and found her covered in feces naked laying on the floor in her closet.  There was evidence of feces in bathroom and on floor.  No vomiting.  Patient does not recall what happened.  Was very confused when nephew found her.  She c/o left arm pain.  Very weak.  Her confusion seems to have resolved with ivf in the ED    Clinical Impression   Pt was admitted with acute encephalopathy and confusion is resolved.   She has a L humeral fx also.  Pt was at home independently and now needs overall mod A for adls (max for UB dressing).  She will benefit from continued OT.  Goals in acute are for overall min A level.     OT Assessment  Patient needs continued OT Services    Follow Up Recommendations  SNF;Supervision/Assistance - 24 hour    Barriers to Discharge      Equipment Recommendations  3 in 1 bedside comode    Recommendations for Other Services    Frequency  Min 2X/week    Precautions / Restrictions Precautions Precautions: Shoulder Type of Shoulder Precautions: No ROM  to left shoulder; NWB LUE; sling Restrictions Weight Bearing Restrictions: No   Pertinent Vitals/Pain No c/o pain    ADL  Grooming: Set up Where Assessed - Grooming: Unsupported sitting Upper Body Bathing: Moderate assistance Where Assessed - Upper Body Bathing: Unsupported sitting Lower Body  Bathing: Moderate assistance Where Assessed - Lower Body Bathing: Supported sit to stand Upper Body Dressing: Maximal assistance Where Assessed - Upper Body Dressing: Unsupported sitting Lower Body Dressing: Moderate assistance Where Assessed - Lower Body Dressing: Supported sit to Pharmacist, hospital: Moderate assistance Toilet Transfer Method: Stand pivot Toilet Transfer Equipment: Bedside commode Toileting - Clothing Manipulation and Hygiene: Moderate assistance Where Assessed - Toileting Clothing Manipulation and Hygiene: Sit to stand from 3-in-1 or toilet Equipment Used:  (hand held assist) Transfers/Ambulation Related to ADLs: SPT to 3:1 then walked 3 feet to chair with min A, hand held assistance ADL Comments: Pt was incontinent of urine.  She is wearing a sling on LUE due to fx.  Hand held/trunk support given during standing for adls.      OT Diagnosis: Generalized weakness  OT Problem List: Decreased strength;Decreased activity tolerance;Impaired UE functional use;Impaired balance (sitting and/or standing);Decreased safety awareness OT Treatment Interventions: Self-care/ADL training;DME and/or AE instruction;Therapeutic activities;Patient/family education;Balance training   OT Goals(Current goals can be found in the care plan section) Acute Rehab OT Goals Patient Stated Goal: none stated OT Goal Formulation: With patient Time For Goal Achievement: 10/09/12 Potential to Achieve Goals: Good ADL Goals Pt Will Perform Upper Body Bathing: with min assist;sitting Pt Will Perform Lower Body Bathing: with min assist;sit to/from stand Pt Will Perform Upper Body Dressing: with mod assist;sitting Pt Will Perform Lower Body Dressing: with min assist;sit to/from stand Pt Will  Transfer to Toilet: with min assist;ambulating;bedside commode  Visit Information  Last OT Received On: 09/25/12 Assistance Needed: +1 History of Present Illness: 79 yo female h/o pulmonary fibrosis overall  independent and relatively healthy was found down this evening by nephew.  Niece says they last spoke to her about 630p last night and she seemed mildly confused.  Then her husband, pt nephew tried calling at around 930p and she did not answer phone.  Tried calling her again this am and no answer either.  Nephew went to check on her later in day and found her covered in feces naked laying on the floor in her closet.  There was evidence of feces in bathroom and on floor.  No vomiting.  Patient does not recall what happened.  Was very confused when nephew found her.  She c/o left arm pain.  Very weak.  Her confusion seems to have resolved with ivf in the ED        Prior Functioning     Home Living Family/patient expects to be discharged to:: Private residence Living Arrangements: Alone Additional Comments: needs 24/7 or snf Prior Function Level of Independence: Independent Communication Communication: No difficulties Dominant Hand: Right         Vision/Perception     Cognition  Cognition Arousal/Alertness: Awake/alert Behavior During Therapy: WFL for tasks assessed/performed Overall Cognitive Status: Impaired/Different from baseline General Comments: Pt incontinent:  didn't call for bathroom    Extremity/Trunk Assessment Upper Extremity Assessment Upper Extremity Assessment: RUE deficits/detail;LUE deficits/detail RUE Deficits / Details: grossly 3+/5 LUE Deficits / Details: immobilized, [name] mild comminuted.      Mobility Bed Mobility Supine to Sit: 4: Min assist Details for Bed Mobility Assistance: cues for technique Transfers Sit to Stand: 3: Mod assist;4: Min assist (mod from bed; min from 3:1) Stand to Sit: 4: Min assist Details for Transfer Assistance: cues for hand placement and safety     Exercise     Balance     End of Session OT - End of Session Activity Tolerance: Patient tolerated treatment well Patient left: in chair;with call bell/phone  within reach;with chair alarm set Nurse Communication:  (RN in room)  GO Functional Assessment Tool Used: clinical observation Functional Limitation: Self care Self Care Current Status (Z6109): At least 40 percent but less than 60 percent impaired, limited or restricted Self Care Goal Status (U0454): At least 20 percent but less than 40 percent impaired, limited or restricted   [facility] 09/25/2012, 11:12 AM Marica Otter, OTR/L [phone] 09/25/2012

## 2012-09-25 NOTE — Progress Notes (Signed)
Physical Therapy Treatment Patient Details Name: Charlene Boone MRN: [id number] DOB: [birth year] Today's Date: 09/25/2012 Time: 1350-1405 PT Time Calculation (min): 15 min  PT Assessment / Plan / Recommendation  PT Comments   *Increased gait distance today. Pt ambulated 140' with min hand held assist for balance. Continues to be fall risk as she is unsteady when walking. SNF recommended. **  Follow Up Recommendations  SNF     Does the patient have the potential to tolerate intense rehabilitation     Barriers to Discharge        Equipment Recommendations  Cane    Recommendations for Other Services    Frequency Min 3X/week   Progress towards PT Goals Progress towards PT goals: Progressing toward goals  Plan Current plan remains appropriate    Precautions / Restrictions Precautions Precautions: Shoulder;Fall Type of Shoulder Precautions: No ROM  to left shoulder; NWB LUE; sling Restrictions Weight Bearing Restrictions: No   Pertinent Vitals/Pain *3/10 R shoulder repositioned**    Mobility  Bed Mobility Bed Mobility: Rolling Right Rolling Right: 4: Min assist Supine to Sit: 4: Min assist Details for Bed Mobility Assistance: cues for technique, assist to elevate trunk Transfers Sit to Stand: 3: Mod assist;4: Min assist;From chair/3-in-1;From bed (mod from bed; min from 3:1) Stand to Sit: 4: Min assist;To chair/3-in-1;To bed Details for Transfer Assistance: cues for hand placement and safety Ambulation/Gait Ambulation Distance (Feet): 140 Feet Assistive device: 1 person hand held assist Ambulation/Gait Assistance Details: assist for balance Gait Pattern: Narrow base of support;Decreased stride length    Exercises     PT Diagnosis:    PT Problem List:   PT Treatment Interventions:     PT Goals (current goals can now be found in the care plan section) Acute Rehab PT Goals Patient Stated Goal: none stated Time For Goal Achievement: 10/08/12 Potential to Achieve  Goals: Good  Visit Information  Assistance Needed: +1 History of Present Illness: 79 yo female h/o pulmonary fibrosis overall independent and relatively healthy was found down this evening by nephew.  Niece says they last spoke to her about 630p last night and she seemed mildly confused.  Then her husband, pt nephew tried calling at around 930p and she did not answer phone.  Tried calling her again this am and no answer either.  Nephew went to check on her later in day and found her covered in feces naked laying on the floor in her closet.  There was evidence of feces in bathroom and on floor.  No vomiting.  Patient does not recall what happened.  Was very confused when nephew found her.  She c/o left arm pain.  Very weak.  Her confusion seems to have resolved with ivf in the ED     Subjective Data  Patient Stated Goal: none stated   Cognition  Cognition Arousal/Alertness: Awake/alert Behavior During Therapy: WFL for tasks assessed/performed Overall Cognitive Status: Within Functional Limits for tasks assessed    Balance     End of Session PT - End of Session Equipment Utilized During Treatment: Gait belt Activity Tolerance: Patient tolerated treatment well Patient left: with call bell/phone within reach;in bed Nurse Communication: Mobility status   GP     Ralene Bathe Kistler 09/25/2012, 2:17 PM [phone]

## 2012-09-25 NOTE — H&P (Deleted)
Physician Discharge Summary  Charlene Boone AVW:[id number] DOB: [birth year] DOA: 09/22/2012  PCP: Default, Provider, MD Primary Pulmonologist: Charlene Boone in [facility] in [place]  Admit date: 09/22/2012 Discharge date: 09/25/2012  Time spent: Greater than 30 minutes  Recommendations for Outpatient Follow-up:  1. [facility] at [facility] in 4 days with repeat labs (CBC and BMP) 2. Charlene Boone, Orthopedics in 1 to 2 weeks. 3. Charlene Boone, Pulmonologist 4. Left arm sling at all times. Nonweightbearing and no PT for left upper extremity. 5. Consider OP hematology consultation for evaluation of leukocytosis & anemia.  Discharge Diagnoses:  Principal Problem:   Acute encephalopathy Active Problems:   Fall at home   Fracture of humerus, left, closed   Altered mental status   Weakness generalized   H/O pulmonary fibrosis   Protein-calorie malnutrition, severe   Dehydration with hyponatremia   Hypokalemia   Discharge Condition: Improved & Stable  Diet recommendation: Regular diet  Filed Weights   09/23/12 0657 09/24/12 0534 09/25/12 0500  Weight: 47.083 kg (103 lb 12.8 oz) 49.2 kg (108 lb 7.5 oz) 50.485 kg (111 lb 4.8 oz)    History of present illness:  79 year old female with history of pulmonary fibrosis, right lung collapse x2, lives alone, history of mild intermittent confusion for a couple of months, unsteady gait and frequent falls, DOE presented to the [facility] ED on 09/22/12 after she was found by family in her home on the floor covered in feces and naked. Patient did not recall what happened. Per family, she was much more confused since Monday. She complained of right arm pain. In the ED, x-rays revealed left humeral neck fracture, sodium 127, CK 240, negative troponin, WBC 19.5, chest x-ray without acute changes and UA negative. Hospitalist admission requested.  Hospital Course:  1. Acute encephalopathy:? Secondary to dehydration and mild  hyponatremia complicating underlying dementia. Clinically seems to have resolved. No focal deficits. CT head and neck without acute findings. Vitamin B12: 471 & TSH: 0.839 2. Left humeral neck fracture: CT confirmed. Nondisplaced. Orthopedics consulted and recommend left arm sling at all times and no PT or weightbearing on the left upper extremity. Followup with orthopedics in 1-2 weeks. Pain is adequately controlled. 3. Dehydration with hyponatremia: Treated with IV fluids and clinically appears euvolemic. Sodium still low at 129.? Element of SIADH. Recommend repeating BMP in 4 days at [facility] and further workup there as deemed necessary. 4. Hypokalemia: Replaced. 5. Recurrent falls: PT and OT recommended SNF for rehabilitation at discharge. 6. Likely dementia: Management per problem #1. 7. History of pulmonary fibrosis/right lung collapse: Stable. Outpatient followup with primary pulmonologist. 8. Leukocytosis: Likely stress margination. No clinical focus of sepsis. No antibiotics started. Follow up CBC at SNF and consider hematology consultation as OP. 9. Severe protein calorie malnutrition: Management per nutrition team. 10. Anemia: Possibly dilutional. Stable. Outpatient followup and evaluation as deemed necessary 11. DO NOT RESUSCITATE. This was confirmed with patient's niece Charlene Boone.   Procedures:  Left arm sling   Consultations:  Orthopedics  Discharge Exam:  Complaints: Mild intermittent left arm pain. Denies any other complaints.  Filed Vitals:   09/24/12 1300 09/24/12 2150 09/25/12 0500 09/25/12 0827  BP: 125/62 139/56 144/58   Pulse: 77 87 81   Temp: 97.9 F (36.6 C) 98.5 F (36.9 C) 97.8 F (36.6 C)   TempSrc: Oral Oral Oral   Resp: 17 16 16    Height:      Weight:   50.485 kg (111  lb 4.8 oz)   SpO2: 100% 100% 100% 99%     General exam: Comfortable and pleasant .   Respiratory system: Reduced breath sounds bilaterally but seems clear to auscultation . Few basal  coarse Velcro-like crackles-likely chronic. No increased work of breathing.   Cardiovascular system: S1 & S2 heard, RRR. No JVD, murmurs, gallops, clicks or pedal edema.   Gastrointestinal system: Abdomen is nondistended, soft and nontender. Normal bowel sounds heard.   Central nervous system: Alert and oriented x3 . No focal neurological deficits.   Extremities: Symmetric 5 x 5 power. Left upper extremity in sling. Peripheral pulses symmetrically well felt.   Discharge Instructions      Discharge Orders   Future Orders Complete By Expires     Call MD for:  difficulty breathing, headache or visual disturbances  As directed     Call MD for:  extreme fatigue  As directed     Call MD for:  persistant dizziness or light-headedness  As directed     Call MD for:  severe uncontrolled pain  As directed     Call MD for:  temperature >100.4  As directed     Call MD for:  As directed     Comments:      Altered mental status    Diet general  As directed     Discharge instructions  As directed     Comments:      1. Left arm sling at all times. No physical therapy to left upper extremity.    Increase activity slowly  As directed         Medication List         acetaminophen 325 MG tablet  Commonly known as:  TYLENOL  Take 2 tablets (650 mg total) by mouth every 6 (six) hours as needed for pain or fever (mild pain).     albuterol 108 (90 BASE) MCG/ACT inhaler  Commonly known as:  PROVENTIL HFA;VENTOLIN HFA  Inhale 2 puffs into the lungs every 6 (six) hours as needed for wheezing.     BREO ELLIPTA 100-25 MCG/INH Aepb  Generic drug:  Fluticasone Furoate-Vilanterol  Inhale 1 puff into the lungs daily.     feeding supplement Liqd  Take 237 mLs by mouth 2 (two) times daily between meals.     feeding supplement Pudg  Take 1 Container by mouth 3 (three) times daily between meals.     loratadine 10 MG tablet  Commonly known as:  CLARITIN  Take 10 mg by mouth daily.     tiotropium  18 MCG inhalation capsule  Commonly known as:  SPIRIVA  Place 18 mcg into inhaler and inhale daily.     traMADol 50 MG tablet  Commonly known as:  ULTRAM  Take 1 tablet (50 mg total) by mouth every 6 (six) hours as needed for pain (Moderate pain).       Follow-up Information   Follow up with M.D. at Skilled Nursing Facility. Schedule an appointment as soon as possible for a visit in 4 days. (To be seen with repeat lab (CBC and BMP))       Schedule an appointment as soon as possible for a visit with Emilee Hero, MD. (To be seen in 1-2 weeks.)    Contact information:   [address] [place] [address] [phone]       Schedule an appointment as soon as possible for a visit with Charlene Boone (Pulmonologist), at [facility] in  [place].       The results of significant diagnostics from this hospitalization (including imaging, microbiology, ancillary and laboratory) are listed below for reference.    Significant Diagnostic Studies: Dg Chest 2 View  09/22/2012   *RADIOLOGY REPORT*  Clinical Data: Fall, weakness.  Left arm pain.  CHEST - 2 VIEW  Comparison: None.  Findings: Severe chronic appearing densities throughout the lungs, likely fibrosis.  Hyperinflation of the lungs.  Biapical pleural thickening.  Heart is normal size.  No effusions.  Partially images a left humeral neck fracture.  Please see humerus Report.  IMPRESSION: Chronic changes/fibrosis.   Original Report Authenticated By: Charlett Nose, M.D.   Ct Head Wo Contrast  09/22/2012   *RADIOLOGY REPORT*  Clinical Data: Weakness, fall, confusion  CT HEAD WITHOUT CONTRAST,CT CERVICAL SPINE WITHOUT CONTRAST  Technique:  Contiguous axial images were obtained from the base of the skull through the vertex without contrast.,Technique: Multidetector CT imaging of the cervical spine was performed. Multiplanar CT image reconstructions were also generated.  Comparison: None.  Findings: No skull fracture is  noted.  Paranasal sinuses and mastoid air cells are unremarkable.  No intracranial hemorrhage, mass effect or midline shift.  No acute infarction. Moderate cerebral atrophy.  Periventricular and subcortical white matter decreased attenuation probable due to chronic small vessel ischemic changes.  No mass lesion is noted on this unenhanced scan.  IMPRESSION: No acute intracranial abnormality.  Moderate cerebral atrophy. Periventricular and subcortical white matter decreased attenuation probable due to chronic small vessel ischemic changes.  CT cervical spine without IV contrast:  Axial images of the cervical spine shows no acute fracture or subluxation.  Computer processed images shows no acute fracture or subluxation. Sagittal images shows some significant disc space flattening with anterior and posterior spurring at C4-C5 level. Mild disc space flattening with mild posterior spurring and mild anterior spurring at C5-C6 level.  Mild disc space flattening with mild anterior and posterior spurring at C6-C7 level.  There is mild to moderate spinal canal stenosis due to posterior spurring at C4- C5 and C5-C6 level.  Mild anterior bridging osteophytes noted at C6- 7 level.  No prevertebral soft tissue swelling.  There is diffuse osteopenia.  Impression: 1.  No acute fracture or subluxation.  Diffuse osteopenia. Degenerative changes as described above.  There are paraseptal emphysematous changes in the lung apices. There is thickening of the interlobular septa and some peripheral fibrosis and bronchiectasis bilateral apical region.   Original Report Authenticated By: Natasha Mead, M.D.   Ct Cervical Spine Wo Contrast  09/22/2012   *RADIOLOGY REPORT*  Clinical Data: Weakness, fall, confusion  CT HEAD WITHOUT CONTRAST,CT CERVICAL SPINE WITHOUT CONTRAST  Technique:  Contiguous axial images were obtained from the base of the skull through the vertex without contrast.,Technique: Multidetector CT imaging of the cervical spine  was performed. Multiplanar CT image reconstructions were also generated.  Comparison: None.  Findings: No skull fracture is noted.  Paranasal sinuses and mastoid air cells are unremarkable.  No intracranial hemorrhage, mass effect or midline shift.  No acute infarction. Moderate cerebral atrophy.  Periventricular and subcortical white matter decreased attenuation probable due to chronic small vessel ischemic changes.  No mass lesion is noted on this unenhanced scan.  IMPRESSION: No acute intracranial abnormality.  Moderate cerebral atrophy. Periventricular and subcortical white matter decreased attenuation probable due to chronic small vessel ischemic changes.  CT cervical spine without IV contrast:  Axial images of the cervical spine shows no acute fracture or subluxation.  Computer processed images shows no acute fracture or subluxation. Sagittal images shows some significant disc space flattening with anterior and posterior spurring at C4-C5 level. Mild disc space flattening with mild posterior spurring and mild anterior spurring at C5-C6 level.  Mild disc space flattening with mild anterior and posterior spurring at C6-C7 level.  There is mild to moderate spinal canal stenosis due to posterior spurring at C4- C5 and C5-C6 level.  Mild anterior bridging osteophytes noted at C6- 7 level.  No prevertebral soft tissue swelling.  There is diffuse osteopenia.  Impression: 1.  No acute fracture or subluxation.  Diffuse osteopenia. Degenerative changes as described above.  There are paraseptal emphysematous changes in the lung apices. There is thickening of the interlobular septa and some peripheral fibrosis and bronchiectasis bilateral apical region.   Original Report Authenticated By: Natasha Mead, M.D.   Ct Shoulder Left Wo Contrast  09/23/2012   *RADIOLOGY REPORT*  Clinical Data: Status post fall.  Evaluate proximal left humeral fracture and possible dislocation.  CT OF THE LEFT SHOULDER WITHOUT CONTRAST  Technique:   Multidetector CT imaging was performed according to the standard protocol. Multiplanar CT image reconstructions were also generated.  Comparison: Radiographs 09/22/2012.  Findings: The humeral head is located.  There is a mildly impacted fracture of the humeral neck with apex anterolateral angulation. This fracture is minimally comminuted and involves the lesser tuberosity.  There is no involvement of the humeral head articular surface.  There is no evidence of scapular or clavicle fracture.  There is a left shoulder hemarthrosis with surrounding soft tissue edema.  No large soft tissue hematoma is identified.  Incidentally noted is moderate fibrosis throughout the left lung. There is associated central bronchiectasis.  Aortic atherosclerosis is partially imaged.  IMPRESSION:  1.  Mildly comminuted and angulated fracture of the left humeral neck as described. 2.  No evidence of involvement of the humeral head articular surface or dislocation. 3.  Pulmonary fibrosis.   Original Report Authenticated By: Carey Bullocks, M.D.   Dg Humerus Left  09/22/2012   *RADIOLOGY REPORT*  Clinical Data: Fall, left arm pain.  LEFT HUMERUS - 2+ VIEW  Comparison: None  Findings: There is a mildly impacted left humeral neck fracture. Possible inferior subluxation of the humeral head may be related to joint effusion.  No additional acute bony abnormality.  IMPRESSION: Mildly impacted left humeral neck fracture.  Slight inferior subluxation.   Original Report Authenticated By: Charlett Nose, M.D.    Microbiology: No results found for this or any previous visit (from the past 240 hour(s)).   Labs: Basic Metabolic Panel:  Recent Labs Lab 09/22/12 1937 09/23/12 0510 09/24/12 0450 09/25/12 0402  NA 127* 129* 130* 129*  K 4.5 3.7 3.4* 3.8  CL 86* 93* 97 96  CO2 26 25 28 28   GLUCOSE 132* 119* 105* 108*  BUN 20 20 15 15   CREATININE 0.64 0.54 0.49* 0.51  CALCIUM 10.0 8.8 8.4 8.2*   Liver Function Tests:  Recent  Labs Lab 09/22/12 1937  AST 27  ALT 17  ALKPHOS 52  BILITOT 0.8  PROT 8.1  ALBUMIN 3.5   No results found for this basename: LIPASE, AMYLASE,  in the last 168 hours No results found for this basename: AMMONIA,  in the last 168 hours CBC:  Recent Labs Lab 09/22/12 1937 09/23/12 0510 09/24/12 0450 09/25/12 0402  WBC 19.5* 16.2* 16.1* 14.8*  NEUTROABS 16.8*  --   --   --   HGB 14.6  12.2 10.9* 10.5*  HCT 41.4 35.0* 31.6* 30.9*  MCV 85.0 84.7 85.6 86.8  PLT 389 339 292 326   Cardiac Enzymes:  Recent Labs Lab 09/22/12 1937 09/23/12 0150  CKTOTAL 240*  --   CKMB 6.3*  --   TROPONINI <0.30 <0.30   BNP: BNP (last 3 results) No results found for this basename: PROBNP,  in the last 8760 hours CBG: No results found for this basename: GLUCAP,  in the last 168 hours  Additional labs:  Vitamin B12: 471  TSH: 0.839   Signed:  Lacye Mccarn  [facility] 09/25/2012, 1:29 PM

## 2012-09-25 NOTE — Progress Notes (Signed)
Report called to [employer], RN at [facility]. Pt's niece to take pt via car to [facility]. [facility] informed of plan. Discharge packet sent with family for them to give to staff at [facility].   Arta Bruce [facility] 09/25/2012

## 2012-09-25 NOTE — Progress Notes (Signed)
Patient cleared for discharge. Packet copied and placed in wall. Patient's niece to transport. Patient excited to be discharged from the hospital.  Oswaldo Cueto C. Raylynn Hersh MSW, LCSW [phone]

## 2012-09-25 NOTE — Progress Notes (Signed)
CSW spoke with patient's niece, bonnie. Gave bed offers. She is requesting [facility]. Called Janie at [facility]. They will begin working on authorization. CSW also notified [employer].  Rondalyn Belford C. Fern Asmar MSW, LCSW [phone]

## 2012-09-26 NOTE — Discharge Summary (Signed)
Physician Discharge Summary  Charlene Boone ZOX:[id number] DOB: 1922/04/26 DOA: 09/22/2012  PCP: Default, Provider, MD Primary Pulmonologist: Dr. Meredeth Ide in [facility] in [place]  Admit date: 09/22/2012 Discharge date: 09/25/2012  Time spent: Greater than 30 minutes  Recommendations for Outpatient Follow-up:  1. [facility] days with repeat labs (CBC and BMP) 2. Dr. Estill Bamberg, Orthopedics in 1 to 2 weeks. 3. Dr. Meredeth Ide, Pulmonologist 4. Left arm sling at all times. Nonweightbearing and no PT for left upper extremity. 5. Consider OP hematology consultation for evaluation of leukocytosis & anemia.  Discharge Diagnoses:  Principal Problem:   Acute encephalopathy Active Problems:   Fall at home   Fracture of humerus, left, closed   Altered mental status   Weakness generalized   H/O pulmonary fibrosis   Protein-calorie malnutrition, severe   Dehydration with hyponatremia   Hypokalemia   Discharge Condition: Improved & Stable  Diet recommendation: Regular diet  Filed Weights   09/23/12 0657 09/24/12 0534 09/25/12 0500  Weight: 47.083 kg (103 lb 12.8 oz) 49.2 kg (108 lb 7.5 oz) 50.485 kg (111 lb 4.8 oz)    History of present illness:  79 year old female with history of pulmonary fibrosis, right lung collapse x2, lives alone, history of mild intermittent confusion for a couple of months, unsteady gait and frequent falls, DOE presented to the [facility] ED on 09/22/12 after she was found by family in her home on the floor covered in feces and naked. Patient did not recall what happened. Per family, she was much more confused since Monday. She complained of right arm pain. In the ED, x-rays revealed left humeral neck fracture, sodium 127, CK 240, negative troponin, WBC 19.5, chest x-ray without acute changes and UA negative. Hospitalist admission requested.  Hospital Course:  1. Acute encephalopathy:? Secondary to dehydration and mild  hyponatremia complicating underlying dementia. Clinically seems to have resolved. No focal deficits. CT head and neck without acute findings. Vitamin B12: 471 & TSH: 0.839 2. Left humeral neck fracture: CT confirmed. Nondisplaced. Orthopedics consulted and recommend left arm sling at all times and no PT or weightbearing on the left upper extremity. Followup with orthopedics in 1-2 weeks. Pain is adequately controlled. 3. Dehydration with hyponatremia: Treated with IV fluids and clinically appears euvolemic. Sodium still low at 129.? Element of SIADH. Recommend repeating BMP in 4 days at [facility] and further workup there as deemed necessary. 4. Hypokalemia: Replaced. 5. Recurrent falls: PT and OT recommended SNF for rehabilitation at discharge. 6. Likely dementia: Management per problem #1. 7. History of pulmonary fibrosis/right lung collapse: Stable. Outpatient followup with primary pulmonologist. 8. Leukocytosis: Likely stress margination. No clinical focus of sepsis. No antibiotics started. Follow up CBC at SNF and consider hematology consultation as OP. 9. Severe protein calorie malnutrition: Management per nutrition team. 10. Anemia: Possibly dilutional. Stable. Outpatient followup and evaluation as deemed necessary 11. DO NOT RESUSCITATE. This was confirmed with patient's niece [name].   Procedures:  Left arm sling   Consultations:  Orthopedics  Discharge Exam:  Complaints: Mild intermittent left arm pain. Denies any other complaints.  Filed Vitals:   09/24/12 2150 09/25/12 0500 09/25/12 0827 09/25/12 1300  BP: 139/56 144/58  139/64  Pulse: 87 81  79  Temp: 98.5 F (36.9 C) 97.8 F (36.6 C)  98.1 F (36.7 C)  TempSrc: Oral Oral  Oral  Resp: 16 16  18   Height:      Weight:  50.485 kg (111 lb  4.8 oz)    SpO2: 100% 100% 99% 99%     General exam: Comfortable and pleasant .   Respiratory system: Reduced breath sounds bilaterally but seems clear to auscultation . Few basal  coarse Velcro-like crackles-likely chronic. No increased work of breathing.   Cardiovascular system: S1 & S2 heard, RRR. No JVD, murmurs, gallops, clicks or pedal edema.   Gastrointestinal system: Abdomen is nondistended, soft and nontender. Normal bowel sounds heard.   Central nervous system: Alert and oriented x3 . No focal neurological deficits.   Extremities: Symmetric 5 x 5 power. Left upper extremity in sling. Peripheral pulses symmetrically well felt.   Discharge Instructions      Discharge Orders   Future Orders Complete By Expires     Call MD for:  difficulty breathing, headache or visual disturbances  As directed     Call MD for:  extreme fatigue  As directed     Call MD for:  persistant dizziness or light-headedness  As directed     Call MD for:  severe uncontrolled pain  As directed     Call MD for:  temperature >100.4  As directed     Call MD for:  As directed     Comments:      Altered mental status    Diet general  As directed     Discharge instructions  As directed     Comments:      1. Left arm sling at all times. Non weightbearing and no physical therapy to left upper extremity.    Increase activity slowly  As directed         Medication List         acetaminophen 325 MG tablet  Commonly known as:  TYLENOL  Take 2 tablets (650 mg total) by mouth every 6 (six) hours as needed for pain or fever (mild pain).     albuterol 108 (90 BASE) MCG/ACT inhaler  Commonly known as:  PROVENTIL HFA;VENTOLIN HFA  Inhale 2 puffs into the lungs every 6 (six) hours as needed for wheezing.     BREO ELLIPTA 100-25 MCG/INH Aepb  Generic drug:  Fluticasone Furoate-Vilanterol  Inhale 1 puff into the lungs daily.     feeding supplement Liqd  Take 237 mLs by mouth 2 (two) times daily between meals.     feeding supplement Pudg  Take 1 Container by mouth 3 (three) times daily between meals.     loratadine 10 MG tablet  Commonly known as:  CLARITIN  Take 10 mg by mouth  daily.     tiotropium 18 MCG inhalation capsule  Commonly known as:  SPIRIVA  Place 18 mcg into inhaler and inhale daily.     traMADol 50 MG tablet  Commonly known as:  ULTRAM  Take 1 tablet (50 mg total) by mouth every 6 (six) hours as needed for pain (Moderate pain).       Follow-up Information   Follow up with M.D. at [facility]. Schedule an appointment as soon as possible for a visit in 4 days. (To be seen with repeat lab (CBC and BMP))       Schedule an appointment as soon as possible for a visit with Emilee Hero, MD. (To be seen in 1-2 weeks.)    Contact information:   [address] [place] [address] [phone]       Schedule an appointment as soon as possible for a visit with Dr. Meredeth Ide (Pulmonologist), at  [facility] in [place].       The results of significant diagnostics from this hospitalization (including imaging, microbiology, ancillary and laboratory) are listed below for reference.    Significant Diagnostic Studies: Dg Chest 2 View  09/22/2012   *RADIOLOGY REPORT*  Clinical Data: Fall, weakness.  Left arm pain.  CHEST - 2 VIEW  Comparison: None.  Findings: Severe chronic appearing densities throughout the lungs, likely fibrosis.  Hyperinflation of the lungs.  Biapical pleural thickening.  Heart is normal size.  No effusions.  Partially images a left humeral neck fracture.  Please see humerus Report.  IMPRESSION: Chronic changes/fibrosis.   Original Report Authenticated By: Charlett Nose, M.D.   Ct Head Wo Contrast  09/22/2012   *RADIOLOGY REPORT*  Clinical Data: Weakness, fall, confusion  CT HEAD WITHOUT CONTRAST,CT CERVICAL SPINE WITHOUT CONTRAST  Technique:  Contiguous axial images were obtained from the base of the skull through the vertex without contrast.,Technique: Multidetector CT imaging of the cervical spine was performed. Multiplanar CT image reconstructions were also generated.  Comparison: None.  Findings: No  skull fracture is noted.  Paranasal sinuses and mastoid air cells are unremarkable.  No intracranial hemorrhage, mass effect or midline shift.  No acute infarction. Moderate cerebral atrophy.  Periventricular and subcortical white matter decreased attenuation probable due to chronic small vessel ischemic changes.  No mass lesion is noted on this unenhanced scan.  IMPRESSION: No acute intracranial abnormality.  Moderate cerebral atrophy. Periventricular and subcortical white matter decreased attenuation probable due to chronic small vessel ischemic changes.  CT cervical spine without IV contrast:  Axial images of the cervical spine shows no acute fracture or subluxation.  Computer processed images shows no acute fracture or subluxation. Sagittal images shows some significant disc space flattening with anterior and posterior spurring at C4-C5 level. Mild disc space flattening with mild posterior spurring and mild anterior spurring at C5-C6 level.  Mild disc space flattening with mild anterior and posterior spurring at C6-C7 level.  There is mild to moderate spinal canal stenosis due to posterior spurring at C4- C5 and C5-C6 level.  Mild anterior bridging osteophytes noted at C6- 7 level.  No prevertebral soft tissue swelling.  There is diffuse osteopenia.  Impression: 1.  No acute fracture or subluxation.  Diffuse osteopenia. Degenerative changes as described above.  There are paraseptal emphysematous changes in the lung apices. There is thickening of the interlobular septa and some peripheral fibrosis and bronchiectasis bilateral apical region.   Original Report Authenticated By: Natasha Mead, M.D.   Ct Cervical Spine Wo Contrast  09/22/2012   *RADIOLOGY REPORT*  Clinical Data: Weakness, fall, confusion  CT HEAD WITHOUT CONTRAST,CT CERVICAL SPINE WITHOUT CONTRAST  Technique:  Contiguous axial images were obtained from the base of the skull through the vertex without contrast.,Technique: Multidetector CT imaging of the  cervical spine was performed. Multiplanar CT image reconstructions were also generated.  Comparison: None.  Findings: No skull fracture is noted.  Paranasal sinuses and mastoid air cells are unremarkable.  No intracranial hemorrhage, mass effect or midline shift.  No acute infarction. Moderate cerebral atrophy.  Periventricular and subcortical white matter decreased attenuation probable due to chronic small vessel ischemic changes.  No mass lesion is noted on this unenhanced scan.  IMPRESSION: No acute intracranial abnormality.  Moderate cerebral atrophy. Periventricular and subcortical white matter decreased attenuation probable due to chronic small vessel ischemic changes.  CT cervical spine without IV contrast:  Axial images of the cervical spine shows no acute fracture  or subluxation.  Computer processed images shows no acute fracture or subluxation. Sagittal images shows some significant disc space flattening with anterior and posterior spurring at C4-C5 level. Mild disc space flattening with mild posterior spurring and mild anterior spurring at C5-C6 level.  Mild disc space flattening with mild anterior and posterior spurring at C6-C7 level.  There is mild to moderate spinal canal stenosis due to posterior spurring at C4- C5 and C5-C6 level.  Mild anterior bridging osteophytes noted at C6- 7 level.  No prevertebral soft tissue swelling.  There is diffuse osteopenia.  Impression: 1.  No acute fracture or subluxation.  Diffuse osteopenia. Degenerative changes as described above.  There are paraseptal emphysematous changes in the lung apices. There is thickening of the interlobular septa and some peripheral fibrosis and bronchiectasis bilateral apical region.   Original Report Authenticated By: Natasha Mead, M.D.   Ct Shoulder Left Wo Contrast  09/23/2012   *RADIOLOGY REPORT*  Clinical Data: Status post fall.  Evaluate proximal left humeral fracture and possible dislocation.  CT OF THE LEFT SHOULDER WITHOUT  CONTRAST  Technique:  Multidetector CT imaging was performed according to the standard protocol. Multiplanar CT image reconstructions were also generated.  Comparison: Radiographs 09/22/2012.  Findings: The humeral head is located.  There is a mildly impacted fracture of the humeral neck with apex anterolateral angulation. This fracture is minimally comminuted and involves the lesser tuberosity.  There is no involvement of the humeral head articular surface.  There is no evidence of scapular or clavicle fracture.  There is a left shoulder hemarthrosis with surrounding soft tissue edema.  No large soft tissue hematoma is identified.  Incidentally noted is moderate fibrosis throughout the left lung. There is associated central bronchiectasis.  Aortic atherosclerosis is partially imaged.  IMPRESSION:  1.  Mildly comminuted and angulated fracture of the left humeral neck as described. 2.  No evidence of involvement of the humeral head articular surface or dislocation. 3.  Pulmonary fibrosis.   Original Report Authenticated By: Carey Bullocks, M.D.   Dg Humerus Left  09/22/2012   *RADIOLOGY REPORT*  Clinical Data: Fall, left arm pain.  LEFT HUMERUS - 2+ VIEW  Comparison: None  Findings: There is a mildly impacted left humeral neck fracture. Possible inferior subluxation of the humeral head may be related to joint effusion.  No additional acute bony abnormality.  IMPRESSION: Mildly impacted left humeral neck fracture.  Slight inferior subluxation.   Original Report Authenticated By: Charlett Nose, M.D.    Microbiology: No results found for this or any previous visit (from the past 240 hour(s)).   Labs: Basic Metabolic Panel:  Recent Labs Lab 09/22/12 1937 09/23/12 0510 09/24/12 0450 09/25/12 0402  NA 127* 129* 130* 129*  K 4.5 3.7 3.4* 3.8  CL 86* 93* 97 96  CO2 26 25 28 28   GLUCOSE 132* 119* 105* 108*  BUN 20 20 15 15   CREATININE 0.64 0.54 0.49* 0.51  CALCIUM 10.0 8.8 8.4 8.2*   Liver Function  Tests:  Recent Labs Lab 09/22/12 1937  AST 27  ALT 17  ALKPHOS 52  BILITOT 0.8  PROT 8.1  ALBUMIN 3.5   No results found for this basename: LIPASE, AMYLASE,  in the last 168 hours No results found for this basename: AMMONIA,  in the last 168 hours CBC:  Recent Labs Lab 09/22/12 1937 09/23/12 0510 09/24/12 0450 09/25/12 0402  WBC 19.5* 16.2* 16.1* 14.8*  NEUTROABS 16.8*  --   --   --  HGB 14.6 12.2 10.9* 10.5*  HCT 41.4 35.0* 31.6* 30.9*  MCV 85.0 84.7 85.6 86.8  PLT 389 339 292 326   Cardiac Enzymes:  Recent Labs Lab 09/22/12 1937 09/23/12 0150  CKTOTAL 240*  --   CKMB 6.3*  --   TROPONINI <0.30 <0.30   BNP: BNP (last 3 results) No results found for this basename: PROBNP,  in the last 8760 hours CBG: No results found for this basename: GLUCAP,  in the last 168 hours  Additional labs:  Vitamin B12: 471  TSH: 0.839   Signed:  Ravenna Legore  [facility] 09/26/2012, 6:06 PM

## 2012-10-12 ENCOUNTER — Ambulatory Visit: Payer: Medicare PPO | Admitting: Internal Medicine

## 2013-02-15 DEATH — deceased

## 2013-04-09 IMAGING — CR DG CHEST 2V
1 series · 2 of 2 positions shown · non-contrast
Comparison: none

REASON FOR EXAM: Prior to f/u appt in office PNEUOTHORAX
COMMENTS:

[Series 5: w chest pa · 0.14mm/px · 2 of 2 slices shown]
[im 1/2]
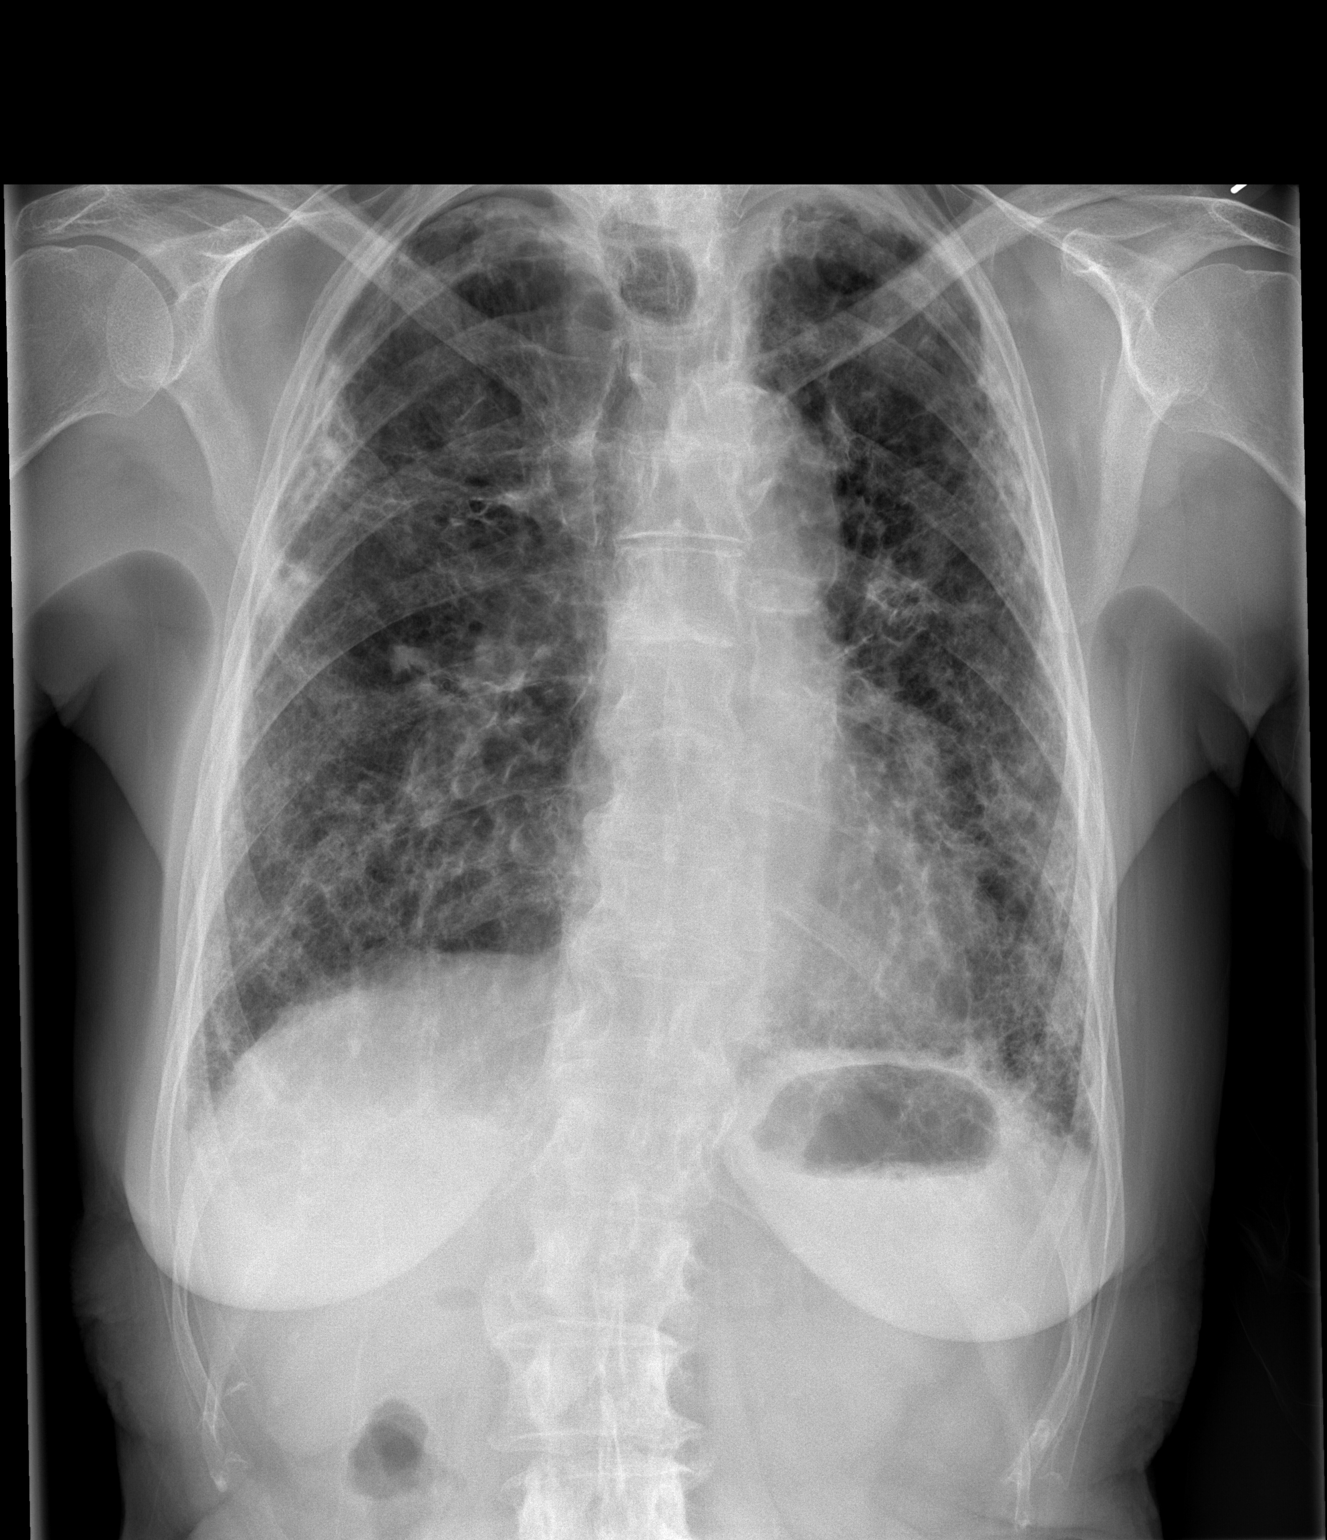
[im 2/2]
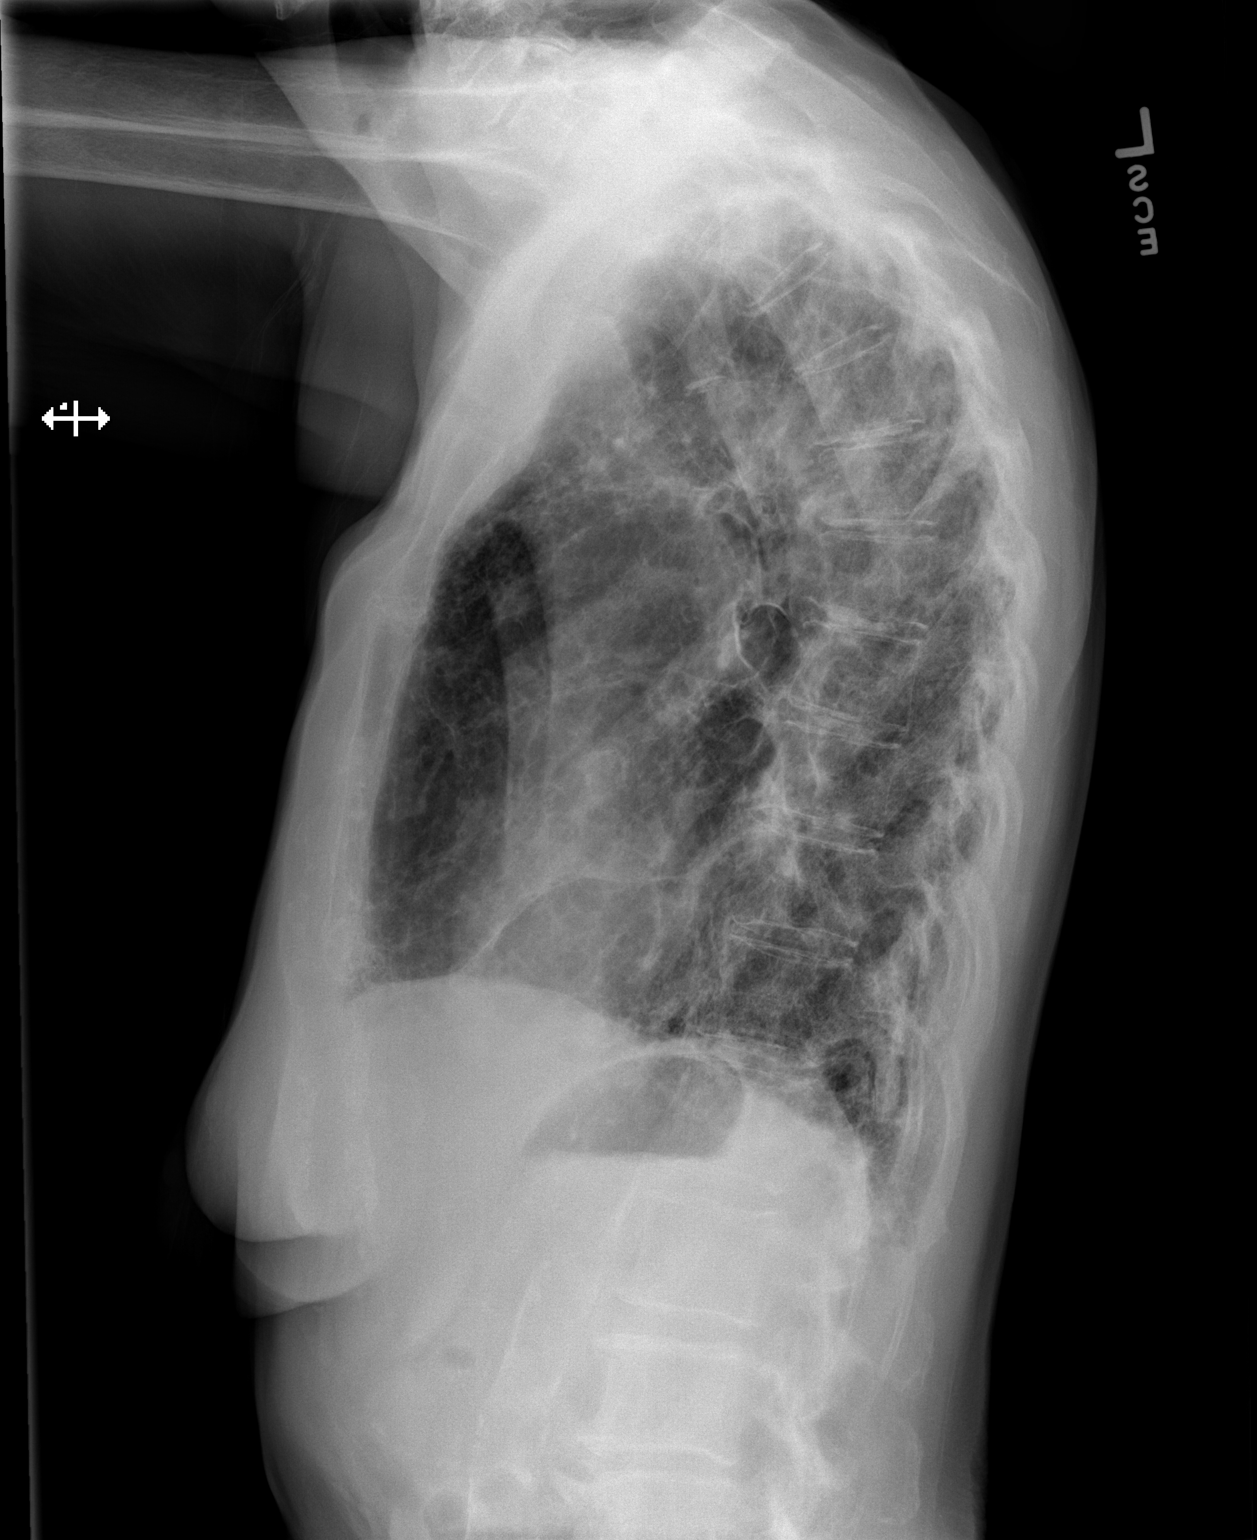

[2 of 2 positions shown; findings below may reference images not displayed]

PROCEDURE:     DXR - DXR CHEST PA (OR AP) AND LATERAL  - June 24, 2012 [DATE]

RESULT:     Comparison is made to study June 16, 2012.

A tiny right apical pneumothorax persists. It has decreased in size since
the previous study. Both lungs exhibit extensive fibrotic change. There is
no shift of the midline. The cardiac silhouette is not enlarged. The
pulmonary vascularity is indistinct. There is tortuosity of the descending
thoracic aorta. The bony thorax exhibits no acute abnormality. No pleural
effusion is evident.
IMPRESSION: 1. A tiny right apical pneumothorax persists.
2. There is severe pulmonary fibrotic change bilaterally.

[REDACTED]

## 2013-07-09 IMAGING — CT CT SHOULDER*L* W/O CM
3 series · 8 of 14 positions shown, 9 images · non-contrast
Comparison: Radiographs 09/22/2012.

CLINICAL DATA: Status post fall.  Evaluate proximal left humeral
fracture and possible dislocation.

CT OF THE LEFT SHOULDER WITHOUT CONTRAST
TECHNIQUE: Multidetector CT imaging was performed according to the
standard protocol. Multiplanar CT image reconstructions were also
generated.

[Series 5: shoulder st · axial · 0.41mm/px · z∈[-173,-107]mm · 2 of 68 slices shown]
[im 23/68  bone]
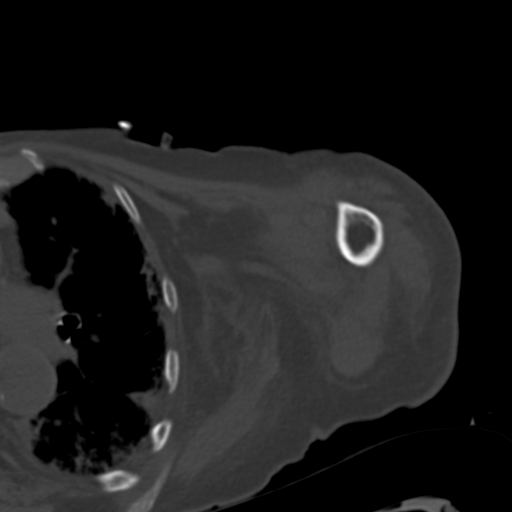
[im 45/68  bone]
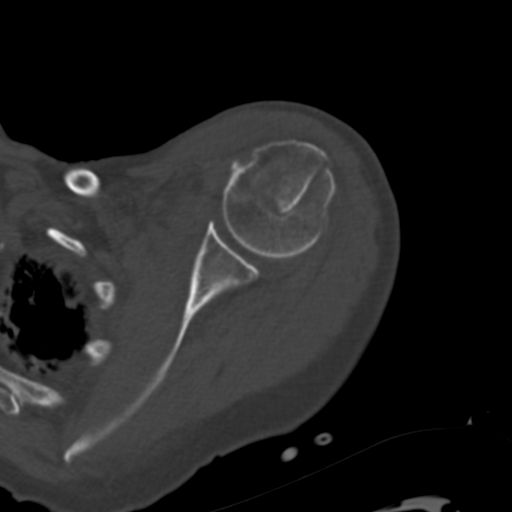

[Series 603: axial · axial · 0.41mm/px · z∈[-191,-100]mm · 3 of 91 slices shown, 4 images (1 of 2)]
[im 23/91  soft-tissue]
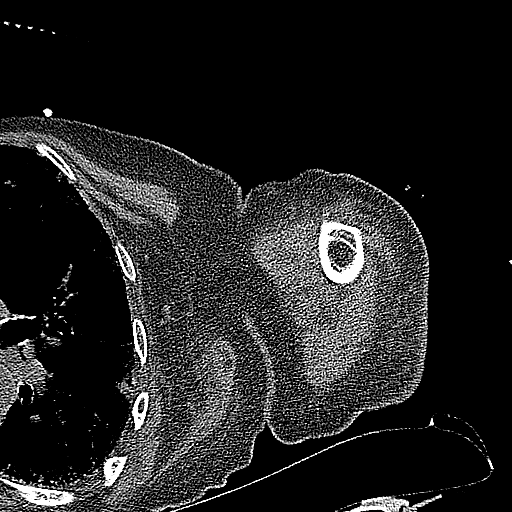
[im 23/91  bone]
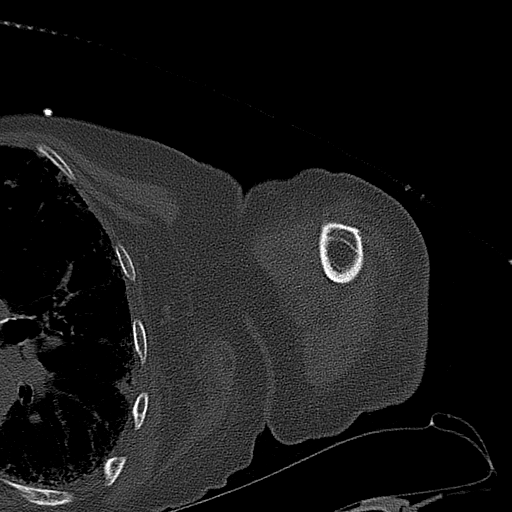
[im 46/91  bone]
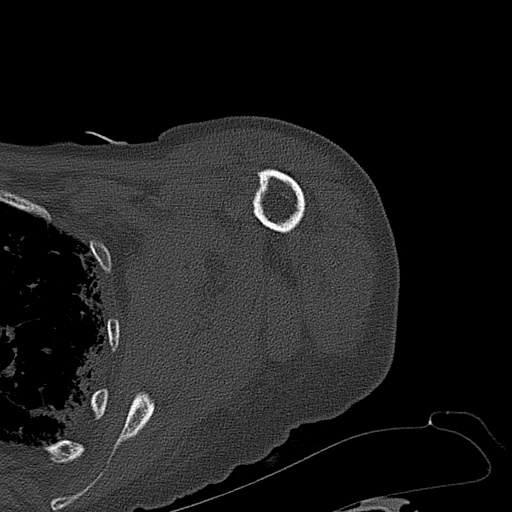
[im 68/91  bone]
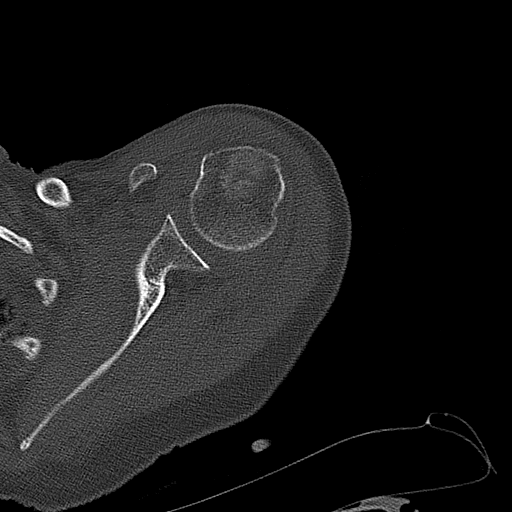

[Series 607: axial · axial · 0.41mm/px · z∈[-172,-96]mm · 3 of 78 slices shown (2 of 2)]
[im 20/78  bone]
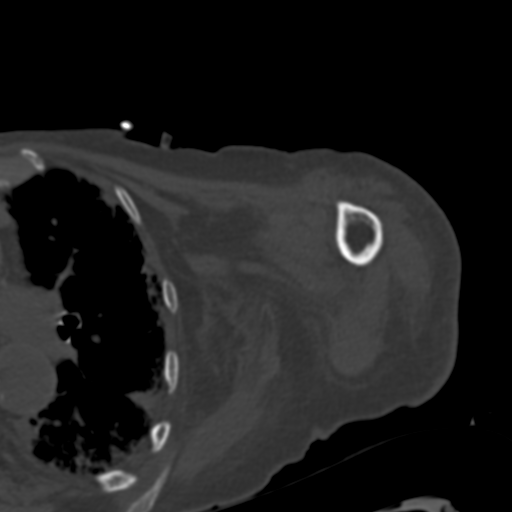
[im 39/78  bone]
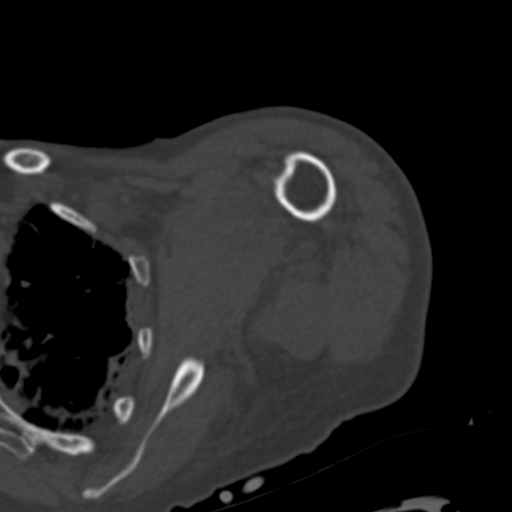
[im 58/78  bone]
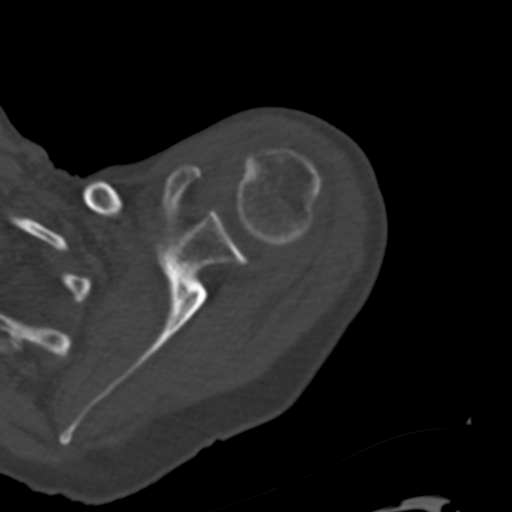

[8 of 14 positions shown; findings below may reference images not displayed]

FINDINGS: The humeral head is located.  There is a mildly impacted
fracture of the humeral neck with apex anterolateral angulation.
This fracture is minimally comminuted and involves the lesser
tuberosity.  There is no involvement of the humeral head articular
surface.

There is no evidence of scapular or clavicle fracture.  There is a
left shoulder hemarthrosis with surrounding soft tissue edema.  No
large soft tissue hematoma is identified.

Incidentally noted is moderate fibrosis throughout the left lung.
There is associated central bronchiectasis.  Aortic atherosclerosis
is partially imaged.
IMPRESSION: 1.  Mildly comminuted and angulated fracture of the left humeral
neck as described.
2.  No evidence of involvement of the humeral head articular
surface or dislocation.
3.  Pulmonary fibrosis.

## 2014-07-08 NOTE — Op Note (Signed)
PATIENT NAME:  Charlene Boone, Charlene Boone MR#:  [id number] DATE OF BIRTH:  Aug 25, 1922  DATE OF PROCEDURE:  06/09/2012  PREOPERATIVE DIAGNOSIS: Recurrent right spontaneous pneumothorax.   POSTOPERATIVE DIAGNOSIS: Recurrent right spontaneous pneumothorax.   PROCEDURE PERFORMED: Insertion of right 24-French straight chest tube.   SURGEON: Briyana Badman A. Egbert GaribaldiBird, MD   ASSISTANTS: None.   ANESTHESIA: 20 mL of 1% lidocaine with epinephrine, 12.5 mg intravenous Phenergan, 4 mg intravenous morphine, 2 mg intravenous Versed.   FINDINGS: Rush of air.   DRAINS: A 24-French straight chest tube.   DESCRIPTION OF PROCEDURE: With informed consent obtained from the patient and her family, she was positioned in the supine position. Intravenous anesthesia was administered with continuous pulse oximetry and blood pressure monitoring. An adequate level of conscious sedation was achieved. The patient's right arm was abducted above her head. The right anterior chest and lateral chest was sterilely prepped and draped with ChloraPrep solution. Timeout was observed.   Local anesthesia was infiltrated along the right lateral chest wall. Skin incision was fashioned with a #11 blade. Utilizing blunt technique and sequential dilation, a 24-French chest tube was placed over a dilator into the pleural cavity and directed anteriorly. It was secured at the skin site with 3 sutures of 2-0 silk at approximately 18 cm. The chest tube was then connected to an [employer] canister. Sterile occlusive dressing was placed. A portable chest x-ray was obtained. The patient was then stable after the procedure, without immediate complication.   ____________________________ Redge GainerMark A. Egbert GaribaldiBird, MD mab:OSi D: 06/10/2012 01:31:24 ET T: 06/10/2012 05:49:46 ET JOB#: [id number]  cc: Loraine LericheMark A. Egbert GaribaldiBird, MD, <Dictator> Kairyn Olmeda A Gracen Ringwald MD ELECTRONICALLY SIGNED 06/10/2012 21:32

## 2014-07-08 NOTE — H&P (Signed)
PATIENT NAME:  Charlene Boone, [name] MR#:  [id number] DATE OF BIRTH:  1922-07-27  DATE OF ADMISSION:  06/09/2012  CHIEF COMPLAINT: Right-sided back pain and exertional shortness of breath.   HISTORY: This is an 81 year old white female, who lives alone, who in April 2013 was admitted to the hospital with sudden onset of right-sided chest pain and a spontaneous pneumothorax which was treated with a chest tube. She did well with only chest tube insertion at that time and was discharged. Yesterday, the patient started having right-sided back pain which was acute in nature, different from her usual type of back pain, which then developed into exertional shortness of breath. [facility], and a chest x-ray by their report demonstrated a right-sided recurrent pneumothorax. She was sent into the Emergency Room, and surgical services were asked to evaluate. I was unable to evaluate the chest x-ray done at [facility] due to the disk not being able to be opened on any computer in the Emergency Room, and as such, a repeat chest x-ray was obtained which demonstrated approximately a 30% right-sided pneumothorax without evidence of tension. No mediastinal shift, no fluid and some interstitial changes which appear to be chronic in nature. At present, the patient is currently stable.   PAST MEDICAL HISTORY: Significant for cataracts.   PAST SURGICAL HISTORY: Open cholecystectomy 1970's, right chest tube 2013 and cataract surgery.   SOCIAL HISTORY: The patient lives alone, takes care of herself. Does not smoke. Does not drink.   REVIEW OF SYSTEMS: As described above.   PHYSICAL EXAMINATION:  GENERAL: The patient is a cachectic thin female with evidence of alopecia. She is alert and oriented x4.  VITAL SIGNS: Temperature is 97.6. Pulse of 79 and regular. Blood pressure is 154/68. Room air saturation is 98%.  HEENT: Conjunctivae are pale. Extraocular muscles are intact. Facies are  normal.  NECK: Supple. Without adenopathy or thyromegaly.  LUNGS: Clear bilaterally with diminished breath sounds on the right side.  ABDOMEN: She denies tenderness in her abdomen.   SKIN: Normal.  NEUROLOGIC: Grossly normal. Cranial nerves are intact.   LABORATORY VALUES: Reviewed. Chest x-ray was reviewed as described above.   IMPRESSION: An 81 year old white female with recurrent spontaneous right-sided pneumothorax and interstitial lung findings seen on plain x-ray. Currently stable. No signs of tension.   PLAN: The patient will have a 24-French straight chest tube placed as talc pleurodesis may be an option during this admission. I discussed with her and her family in detail. All of their questions were answered. I will ask Dr. Thelma Bargeaks his opinion from a thoracic standpoint in the morning.   TOTAL TIME SPENT: 60 minutes.   ____________________________ Redge GainerMark A. Egbert GaribaldiBird, MD mab:gb D: 06/10/2012 00:22:30 ET T: 06/10/2012 00:35:32 ET JOB#: [id number]  cc: Loraine LericheMark A. Egbert GaribaldiBird, MD, <Dictator> Raynald KempMARK A Reverie Vaquera MD ELECTRONICALLY SIGNED 06/10/2012 21:31

## 2014-07-08 NOTE — H&P (Signed)
   Subjective/Chief Complaint right sided back  pain   History of Present Illness 81 y/o with hx of spontaneous right sided ptx in april 2013 treated with simple chest tube.  Known interstitial changes at that time.  non-smoker.  Developed rather sudden onset of right sided then diffuse chest pain yesterday.  Seen in [facility] urgent care and cxr shows right sided recurrent PTX.  Sent to ER.  Surgical services asked to evaluate.  I was unable to look at CXR done earlier and repeated and shows large PTX without tension.   Past History as above lives alone and cares for herself.   Primary Physician none   Past Med/Surgical Hx:  Pneumothorax:   Cataract Extraction:   Cholecystectomy: 1970's  ALLERGIES:  No Known Allergies:    Other Allergies none   Family and Social History:  Family History Non-Contributory   Social History negative tobacco, negative ETOH, negative Illicit drugs   Place of Living Home   Review of Systems:  Subjective/Chief Complaint see above   Tolerating Diet Yes   Medications/Allergies Reviewed Medications/Allergies reviewed   Physical Exam:  GEN no acute distress, cachectic, thin, temp 97.6, p 79 bp 154/68 ra sats 98%   HEENT pale conjunctivae   NECK supple   RESP normal resp effort  clear BS  diminished on right side   ABD denies tenderness   LYMPH negative neck   SKIN normal to palpation, No rashes, skin turgor good   NEURO cranial nerves intact   PSYCH A+O to time, place, person   Lab Results:  Hepatic:  25-Mar-14 16:31   Bilirubin, Total 0.3  Alkaline Phosphatase 81  SGPT (ALT) 15  SGOT (AST) 27  Total Protein, Serum 8.2  Albumin, Serum 3.4  Routine Chem:  25-Mar-14 16:31   Glucose, Serum 95  BUN 11  Creatinine (comp) 0.71  Sodium, Serum  130  Potassium, Serum 4.0  Chloride, Serum 98  CO2, Serum 28  Calcium (Total), Serum 9.0  Osmolality (calc) 260  eGFR (African American) >60  eGFR (Non-African American) >60 (eGFR values  <48mL/min/1.73 m2 may be an indication of chronic kidney disease (CKD). Calculated eGFR is useful in patients with stable renal function. The eGFR calculation will not be reliable in acutely ill patients when serum creatinine is changing rapidly. It is not useful in  patients on dialysis. The eGFR calculation may not be applicable to patients at the low and high extremes of body sizes, pregnant women, and vegetarians.)  Anion Gap  4  Routine Hem:  25-Mar-14 16:31   WBC (CBC)  12.0  RBC (CBC) 4.51  Hemoglobin (CBC) 13.0  Hematocrit (CBC) 40.4  Platelet Count (CBC) 310 (Result(s) reported on 09 Jun 2012 at 06:36PM.)  MCV 90  MCH 28.9  MCHC 32.3  RDW 13.2    Assessment/Admission Diagnosis 81 y/o female with recurrent spontaneous PTX, interstitial lung findings on plain xray, She is stable,  No signs of tension PTX.   Plan Placement of 24 fr right sided chest tube as talc pleurodesis may be an option. Discussed with her and family in detail and all questions addressed. I will ask Dr. Thelma Barge for his opinion in am.  Total time spent 60 minutes.   Electronic Signatures: Natale Lay (MD)  (Signed [id number] 20:40)  Authored: CHIEF COMPLAINT and HISTORY, PAST MEDICAL/SURGIAL HISTORY, ALLERGIES, Other Allergies, HOME MEDICATIONS, FAMILY AND SOCIAL HISTORY, REVIEW OF SYSTEMS, PHYSICAL EXAM, LABS, ASSESSMENT AND PLAN   Last Updated: 25-Mar-14 20:40 by Natale Lay (MD)

## 2014-07-08 NOTE — Discharge Summary (Signed)
PATIENT NAME:  Charlene Boone, Charlene Boone MR#:  [id number] DATE OF BIRTH:  Aug 17, 1922  DATE OF ADMISSION:  06/09/2012 DATE OF DISCHARGE:  06/16/2012  DISCHARGE DIAGNOSIS: Recurrent right pneumothorax.   PROCEDURES: Chest tube placement.   HISTORY OF PRESENT ILLNESS AND HOSPITAL COURSE: This is a patient with no medical problems, who does not see a medical doctor, presented to the Emergency Room and Dr. Egbert GaribaldiBird saw the patient for a recurrent right pneumothorax. Dr. Egbert GaribaldiBird placed a right chest tube and her air leak promptly resolved. Serial x-rays showed a very small apical pneumothorax.  Her air leak resolved, and the chest tube was removed. She was discharged in stable condition to follow up in our office next week with a chest x-ray and to discuss possible pleurodesis in Dr. Thelma Bargeaks' office.  She was given Vicodin for pain and will follow up in our office next week.   ____________________________ Adah Salvageichard E. Excell Seltzerooper, MD rec:cb D: 06/16/2012 18:08:28 ET T: 06/16/2012 23:40:18 ET JOB#: [id number]  cc: Adah Salvageichard E. Excell Seltzerooper, MD, <Dictator> Lattie HawICHARD E Kylo Gavin MD ELECTRONICALLY SIGNED 06/17/2012 7:26

## 2014-07-10 NOTE — H&P (Signed)
PATIENT NAME:  Charlene Boone, Charlene Boone MR#:  [id number] DATE OF BIRTH:  September 10, 1922  DATE OF ADMISSION:  06/28/2011  PRIMARY CARE PHYSICIAN: None.   ADMITTING PHYSICIAN: Quentin Ore, III, MD   CHIEF COMPLAINT: Shortness of breath.   BRIEF HISTORY: Charlene Boone is an 81 year old woman seen in the Emergency Room with a several-day history of increasing shortness of breath and mild right chest pain. She has not had previous similar symptoms in the past and had noted some exertional dyspnea. The symptoms worsened today, and she went to the [facility] facility. She does not have a regular family doctor. Chest x-ray in the [facility] identified a possible partial pneumothorax, and she was referred to the Emergency Room for further evaluation. Work-up in the Emergency Room confirmed a significant right-sided pneumothorax without evidence of tension, and the Surgical Service was consulted.   She does not have any previous history of significant lung problems, although she does not have a regular doctor and has not had regular health care over the last decade or two. She does not have any smoking history. She does not have any normal shortness of breath. She has no cardiac symptoms of note, has not had any chest pain or dysrhythmia in the past. She denies history of hepatitis, yellow jaundice, pancreatitis, peptic ulcer disease, or diverticulitis. She has had gallbladder disease with a previous cholecystectomy. She has no cardiac disease, hypertension, or diabetes.   MEDICATIONS: She takes no medications regularly.   ALLERGIES: She has no medical allergies.   REVIEW OF SYSTEMS: Other than her most recent malaise and dyspnea, she has had no other significant review of systems.   SOCIAL HISTORY: She lives alone. Her daughter is present.    PHYSICAL EXAMINATION:  GENERAL: She appears comfortable at rest.   VITAL SIGNS: Her vital signs are stable with a blood pressure of 128/64. Heart rate is  92 and regular. Oxygen saturation is 91% on 2 liters.   HEENT: Exam is positive for some alopecia, some ruddy sclerae. Pupils are equally round.   NECK: Supple without adenopathy. Trachea is midline. Thyroid appears to be a bit firm to my examination. Neck is otherwise unremarkable.   CHEST: Chest is clear, but she does have decreased breath sounds on the right side. She has normal pulmonary excursion, and no pleuritic chest pain.   CARDIAC: No murmurs or gallops to my ear. She seems to be in normal sinus rhythm.   ABDOMEN: Her abdomen is soft with no tenderness. No rebound. No guarding. No masses. No hernias.   EXTREMITIES: Exam reveals full range of motion, good distal pulses and no deformities.   PSYCHIATRIC: Exam reveals a normal elderly woman with good affect and no depression. She seems to be appropriately oriented.   IMPRESSION/PLAN: I have independently reviewed her chest x-ray. She does appear to have a significant right-sided spontaneous pneumothorax. The lung does have some interstitial disease in addition. No other masses are identified. A chest tube was placed without difficulty and dictated under a separate note. She does not appear to have any further air leak.   I will admit her to the hospital for further evaluation and control of her air leak and pneumothorax. We will likely get a Cardiothoracic consult and possibly an Internal Medicine consult. This plan has been discussed with the patient and her daughter, and they are in agreement.  ____________________________ Carmie End, MD rle:cbb D: 06/28/2011 18:55:50 ET T: 06/28/2011 19:28:43 ET JOB#: [id number] Charlene Boone  Vivianne MasterL ELY MD ELECTRONICALLY SIGNED 06/29/2011 10:43

## 2014-07-10 NOTE — Consult Note (Signed)
PATIENT NAME:  Charlene Boone, [name]:  [id number] DATE OF BIRTH:  Mar 15, 1923  DATE OF CONSULTATION:  07/01/2011  REFERRING PHYSICIAN:  Dr. Michela PitcherEly  CONSULTING PHYSICIAN:  Sheppard Plumberimothy E. Tashima Scarpulla, MD  REASON FOR CONSULTATION: Spontaneous pneumothorax right side.   I have personally seen and examined this patient. I've independently reviewed her chart as well as her chest x-rays.   HISTORY OF PRESENT ILLNESS: Ms. Charlene Boone is a very pleasant 81 year old woman who was in her usual state of health until earlier this week when she was outside picking up some sticks and other yard debris. The following day she began to notice some shortness of breath and had some coughing episodes primarily at night. She states that for three consecutive nights she coughed and at one point became so short of breath that she thought she should seek medical attention. She presented to the Emergency Room where a chest x-ray was obtained which revealed a 50% right-sided pneumothorax. A chest tube was inserted and the patient's lung expanded and she felt symptomatically much improved. She has had no prior history of any lung disease. She states she has been short of breath for a couple of years. She lives alone and she does not smoke, although she was exposed to secondhand smoke for most of her life. She had previously worked in a [employer] and was not exposed to any particulate Psychologist, clinicalmatter.   PHYSICAL EXAMINATION:   GENERAL: She is a pleasant well developed woman who is thin yet appears quite energetic. She is awake, alert, and appropriate. There is a chest tube present in the right hemithorax. There is no air leak. There is no subcutaneous emphysema.   LUNGS: Her lungs showed slightly diminished breath sounds on the right with some coarse and inspiratory findings consistent with a pleural rub. The left lung is clear.   ABDOMEN: Her abdomen is soft and nontender.   LYMPH: There were no palpable nodes in her neck or supraclavicular space.    I have independently reviewed the patient's chest x-rays. The lungs show some diffuse interstitial markings consistent with the patient's age. I see no acute abnormality on this. The chest tube appears to be in good position with the lung nearly fully expanded. There is probably a small apical pneumothorax. She does not have an air leak today.   I believe that I would pull the chest tube when clinically appropriate. I explained to her that there is the possibility that these could recur but at this point in time I would not recommend any additional surgical intervention or diagnostic modalities.   Thank you very much for allowing me to participate in her care.   ____________________________ Sheppard Plumberimothy E. Thelma Bargeaks, MD teo:drc D: 07/01/2011 16:44:04 ET T: 07/02/2011 08:50:26 ET JOB#: [id number]  cc: Marcial Pacasimothy E. Thelma Bargeaks, MD, <Dictator> Jasmine DecemberIMOTHY E Spruha Weight MD ELECTRONICALLY SIGNED 07/02/2011 11:28

## 2014-07-10 NOTE — Consult Note (Signed)
Brief Consult Note: Diagnosis: right spontaneous pneumothorax.   Patient was seen by consultant.   Consult note dictated.   Discussed with Attending MD.   Comments: 1.  Spontaneous pneumothorax secondary to strenuous yard work or coughing 2.  No further diagnostic or therapeutic interventions at this time  3.  Consider CT of the chest only for recurrence of pneumothorax.  Electronic Signatures: Jasmine Decemberaks, Selden Noteboom E (MD)  (Signed 15-Apr-13 16:45)  Authored: Brief Consult Note   Last Updated: 15-Apr-13 16:45 by Jasmine Decemberaks, Annina Piotrowski E (MD)

## 2014-07-10 NOTE — Discharge Summary (Signed)
PATIENT NAME:  Charlene Boone, Latreshia MR#:  [id number] DATE OF BIRTH:  20-May-1922  DATE OF ADMISSION:  06/28/2011 DATE OF DISCHARGE:  07/02/2011  BRIEF HISTORY: Charlene RaddleJean Anna is an 81 year old woman who was seen in the Emergency Room with spontaneous pneumothorax. She developed some mild right chest pain and shortness of breath which prompted her Emergency Room evaluation. Work-up revealed a moderate pneumothorax on that side. She has no previous history of this type of problem. She was admitted to the hospital following placement of a 20 French chest tube in the right pleural space. She had no significant problems with the re-expansion of the lung and follow-up chest x-ray revealed marked improvement in her lung expansion. However, overnight she inadvertently pulled her chest tube output. It was replaced on the morning of the 13th and her lung continued to remain nearly fully expanded. She had no air leak following the first 24 hours. Dr. Westley Gamblesim Oaks of the thoracic service saw the patient on the 15th and felt that she had adequate expansion and the patient could have her tube removed and followed in the office as an outpatient. Chest tube was removed on the evening of April 15th. This morning chest x-ray revealed a small residual pneumothorax but she is asymptomatic and does not appear to have any problems tolerating this pneumothorax. She is interested in being discharged and we will follow her up as an outpatient.   DISCHARGE MEDICATIONS:  1. Vicodin for pain 325/5.  2. Loratadine 10 mg p.o. at bedtime p.r.n.   FINAL DISCHARGE DIAGNOSIS:  1. Spontaneous pneumothorax.  2. Sinus congestion. 3. Alopecia.    SURGERY:  Right tube thoracostomy x2.   ____________________________ Carmie Endalph L. Ely III, MD rle:rbg D: 07/02/2011 18:27:49 ET T: 07/04/2011 09:39:41 ET JOB#: [id number] Quentin OreALPH L ELY MD ELECTRONICALLY SIGNED 07/12/2011 22:17

## 2014-07-10 NOTE — Op Note (Signed)
PATIENT NAME:  Presley RaddleBASINGER, Ronette MR#:  [id number] DATE OF BIRTH:  1922/09/14  DATE OF PROCEDURE:  06/28/2011  PREOPERATIVE DIAGNOSIS: Right-sided pneumothorax.   POSTOPERATIVE DIAGNOSIS: Right-sided pneumothorax.  OPERATION: Insertion of a right-sided chest tube.   SURGEON: Quentin Orealph L. Ely, III, MD   ANESTHESIA: Local.   OPERATIVE PROCEDURE: With the patient in the left side down, right side up position, the patient's right chest was prepped with Betadine and draped with sterile towels; 025% Marcaine was used to anesthetize the lateral chest wall. An incision was made in the lateral chest wall without difficulty, and a 20-French chest tube was inserted over a guide into the thoracic cavity. Immediate return of air was noted. The tube was secured to the Pleur-evac and placed on suction. The tube was secured in place with 3-0 silk. Sterile dressings were applied.  A chest x-ray will be repeated to confirm appropriate placement.   ____________________________ Carmie Endalph L. Ely III, MD rle:cbb D: 06/28/2011 18:57:04 ET T: 06/29/2011 15:29:25 ET JOB#: [id number]  cc: Quentin Orealph L. Ely III, MD, <Dictator> Quentin OreALPH L ELY MD ELECTRONICALLY SIGNED 06/30/2011 7:44
